# Patient Record
Sex: Male | Born: 1960 | Marital: Single | Smoking: Former smoker
Health system: Southern US, Community
[De-identification: ages and names within clinical notes are randomized; demographics above are authoritative.]

## PROBLEM LIST (undated history)

## (undated) DIAGNOSIS — C61 Malignant neoplasm of prostate: Secondary | ICD-10-CM

## (undated) DIAGNOSIS — B1920 Unspecified viral hepatitis C without hepatic coma: Secondary | ICD-10-CM

## (undated) DIAGNOSIS — U071 COVID-19: Secondary | ICD-10-CM

---

## 2019-05-18 ENCOUNTER — Other Ambulatory Visit: Payer: Self-pay

## 2019-05-18 ENCOUNTER — Inpatient Hospital Stay (HOSPITAL_COMMUNITY)
Admission: EM | Admit: 2019-05-18 | Discharge: 2019-05-24 | DRG: 177 | Attending: Internal Medicine | Admitting: Internal Medicine

## 2019-05-18 ENCOUNTER — Encounter (HOSPITAL_COMMUNITY): Payer: Self-pay

## 2019-05-18 ENCOUNTER — Emergency Department (HOSPITAL_COMMUNITY)

## 2019-05-18 DIAGNOSIS — J1282 Pneumonia due to coronavirus disease 2019: Secondary | ICD-10-CM | POA: Diagnosis present

## 2019-05-18 DIAGNOSIS — Z8615 Personal history of latent tuberculosis infection: Secondary | ICD-10-CM

## 2019-05-18 DIAGNOSIS — U071 COVID-19: Principal | ICD-10-CM | POA: Diagnosis present

## 2019-05-18 DIAGNOSIS — R0602 Shortness of breath: Secondary | ICD-10-CM

## 2019-05-18 DIAGNOSIS — J9601 Acute respiratory failure with hypoxia: Secondary | ICD-10-CM | POA: Diagnosis present

## 2019-05-18 DIAGNOSIS — Z8546 Personal history of malignant neoplasm of prostate: Secondary | ICD-10-CM | POA: Diagnosis present

## 2019-05-18 DIAGNOSIS — Z923 Personal history of irradiation: Secondary | ICD-10-CM

## 2019-05-18 DIAGNOSIS — Z87891 Personal history of nicotine dependence: Secondary | ICD-10-CM

## 2019-05-18 DIAGNOSIS — Z8619 Personal history of other infectious and parasitic diseases: Secondary | ICD-10-CM

## 2019-05-18 DIAGNOSIS — J1289 Other viral pneumonia: Secondary | ICD-10-CM | POA: Diagnosis present

## 2019-05-18 HISTORY — DX: Unspecified viral hepatitis C without hepatic coma: B19.20

## 2019-05-18 HISTORY — DX: Malignant neoplasm of prostate: C61

## 2019-05-18 HISTORY — DX: COVID-19: U07.1

## 2019-05-18 LAB — CBC WITH DIFFERENTIAL/PLATELET
Abs Immature Granulocytes: 0.03 10*3/uL (ref 0.00–0.07)
Basophils Absolute: 0 10*3/uL (ref 0.0–0.1)
Basophils Relative: 0 %
Eosinophils Absolute: 0 10*3/uL (ref 0.0–0.5)
Eosinophils Relative: 0 %
HCT: 43.6 % (ref 39.0–52.0)
Hemoglobin: 14 g/dL (ref 13.0–17.0)
Immature Granulocytes: 0 %
Lymphocytes Relative: 15 %
Lymphs Abs: 1.1 10*3/uL (ref 0.7–4.0)
MCH: 29 pg (ref 26.0–34.0)
MCHC: 32.1 g/dL (ref 30.0–36.0)
MCV: 90.5 fL (ref 80.0–100.0)
Monocytes Absolute: 0.5 10*3/uL (ref 0.1–1.0)
Monocytes Relative: 7 %
Neutro Abs: 5.6 10*3/uL (ref 1.7–7.7)
Neutrophils Relative %: 78 %
Platelets: 236 10*3/uL (ref 150–400)
RBC: 4.82 MIL/uL (ref 4.22–5.81)
RDW: 12.4 % (ref 11.5–15.5)
WBC: 7.2 10*3/uL (ref 4.0–10.5)
nRBC: 0 % (ref 0.0–0.2)

## 2019-05-18 LAB — COMPREHENSIVE METABOLIC PANEL
ALT: 23 U/L (ref 0–44)
AST: 42 U/L — ABNORMAL HIGH (ref 15–41)
Albumin: 3.1 g/dL — ABNORMAL LOW (ref 3.5–5.0)
Alkaline Phosphatase: 30 U/L — ABNORMAL LOW (ref 38–126)
Anion gap: 13 (ref 5–15)
BUN: 10 mg/dL (ref 6–20)
CO2: 20 mmol/L — ABNORMAL LOW (ref 22–32)
Calcium: 8 mg/dL — ABNORMAL LOW (ref 8.9–10.3)
Chloride: 101 mmol/L (ref 98–111)
Creatinine, Ser: 1.13 mg/dL (ref 0.61–1.24)
GFR calc Af Amer: >60 mL/min (ref >60)
GFR calc non Af Amer: >60 mL/min (ref >60)
Glucose, Bld: 114 mg/dL — ABNORMAL HIGH (ref 70–99)
Potassium: 3.8 mmol/L (ref 3.5–5.1)
Sodium: 134 mmol/L — ABNORMAL LOW (ref 135–145)
Total Bilirubin: 1.3 mg/dL — ABNORMAL HIGH (ref 0.3–1.2)
Total Protein: 7.3 g/dL (ref 6.5–8.1)

## 2019-05-18 MED ORDER — ALBUTEROL SULFATE HFA 108 (90 BASE) MCG/ACT IN AERS
6.0000 | INHALATION_SPRAY | RESPIRATORY_TRACT | Status: DC | PRN
  Administered 2019-05-18: 22:00:00 6 via RESPIRATORY_TRACT
  Filled 2019-05-18: qty 6.7

## 2019-05-18 MED ORDER — DEXAMETHASONE SODIUM PHOSPHATE 10 MG/ML IJ SOLN
10.0000 mg | Freq: Once | INTRAMUSCULAR | Status: AC
  Administered 2019-05-18: 23:00:00 10 mg via INTRAVENOUS
  Filled 2019-05-18: qty 1

## 2019-05-18 NOTE — ED Provider Notes (Signed)
[facility] EMERGENCY DEPARTMENT Provider Note   CSN: [id number] Arrival date & time: 05/18/19  1957     History   Chief Complaint Chief Complaint  Patient presents with  . Shortness of Breath    HPI Philip Reed is a 58 y.o. male.     Patient complains of shortness of breath.  Patient has been admitted recently for work-up of infection [facility] and has become short of breath and hypoxic.  The history is provided by the patient and a caregiver. No language interpreter was used.  Shortness of Breath Severity:  Moderate Onset quality:  Sudden Timing:  Constant Progression:  Worsening Chronicity:  Recurrent Context: activity   Relieved by:  Nothing Worsened by:  Nothing Ineffective treatments:  None tried Associated symptoms: no abdominal pain, no chest pain, no cough, no headaches and no rash     Past Medical History:  Diagnosis Date  . COVID-19   . Hepatitis C   . Prostate cancer ([place])     There are no active problems to display for this patient.   History reviewed. No pertinent surgical history.      Home Medications    Prior to Admission medications   Not on File    Family History History reviewed. No pertinent family history.  Social History Social History   Tobacco Use  . Smoking status: Former Smoker    Packs/day: 0.25    Types: Cigarettes  . Smokeless tobacco: Never Used  Substance Use Topics  . Alcohol use: Not Currently    Alcohol/week: 2.0 standard drinks    Types: 2 Shots of liquor per week  . Drug use: Yes    Types: Marijuana, Cocaine, IV     Allergies   Patient has no allergy information on record.   Review of Systems Review of Systems  Constitutional: Negative for appetite change and fatigue.  HENT: Negative for congestion, ear discharge and sinus pressure.   Eyes: Negative for discharge.  Respiratory: Positive for shortness of breath. Negative for cough.   Cardiovascular: Negative for chest pain.   Gastrointestinal: Negative for abdominal pain and diarrhea.  Genitourinary: Negative for frequency and hematuria.  Musculoskeletal: Negative for back pain.  Skin: Negative for rash.  Neurological: Negative for seizures and headaches.  Psychiatric/Behavioral: Negative for hallucinations.     Physical Exam Updated Vital Signs BP 120/69   Pulse 100   Temp 98.9 F (37.2 C) (Oral)   Resp (!) 41   Ht 6\' 4"  (1.93 m)   Wt 108.9 kg   SpO2 96%   BMI 29.21 kg/m   Physical Exam Vitals signs and nursing note reviewed.  Constitutional:      Appearance: He is well-developed.  HENT:     Head: Normocephalic.     Nose: Nose normal.  Eyes:     General: No scleral icterus.    Conjunctiva/sclera: Conjunctivae normal.  Neck:     Musculoskeletal: Neck supple.     Thyroid: No thyromegaly.  Cardiovascular:     Rate and Rhythm: Normal rate and regular rhythm.     Heart sounds: No murmur. No friction rub. No gallop.   Pulmonary:     Effort: Respiratory distress present.     Breath sounds: No stridor. No wheezing or rales.  Chest:     Chest wall: No tenderness.  Abdominal:     General: There is no distension.     Tenderness: There is no abdominal tenderness. There is no rebound.  Musculoskeletal: Normal range of motion.  Lymphadenopathy:     Cervical: No cervical adenopathy.  Skin:    Findings: No erythema or rash.  Neurological:     Mental Status: He is oriented to person, place, and time.     Motor: No abnormal muscle tone.     Coordination: Coordination normal.  Psychiatric:        Behavior: Behavior normal.      ED Treatments / Results  Labs (all labs ordered are listed, but only abnormal results are displayed) Labs Reviewed  COMPREHENSIVE METABOLIC PANEL - Abnormal; Notable for the following components:      Result Value   Sodium 134 (*)    CO2 20 (*)    Glucose, Bld 114 (*)    Calcium 8.0 (*)    Albumin 3.1 (*)    AST 42 (*)    Alkaline Phosphatase 30 (*)    Total  Bilirubin 1.3 (*)    All other components within normal limits  CBC WITH DIFFERENTIAL/PLATELET    EKG None  Radiology Dg Chest Portable 1 View  Result Date: 05/18/2019 CLINICAL DATA:  58 year old male recently diagnosed with COVID-19. Shortness of breath. EXAM: PORTABLE CHEST 1 VIEW COMPARISON:  Person toggle case portable chest 05/16/2019. FINDINGS: Portable AP upright view at 2137 hours. Increasing multifocal bilateral confluent but indistinct pulmonary opacity, more pronounced in the right lung. Stable somewhat low lung volumes. No pneumothorax or pleural effusion. Mediastinal contours and tracheal air column remain within normal limits. Negative visible bowel gas. No acute osseous abnormality identified. IMPRESSION: Increasing bilateral airspace opacity since 05/16/2019 compatible with progressive bilateral COVID-19 pneumonia in this clinical setting. Electronically Signed   By: Genevie Ann M.D.   On: 05/18/2019 21:56    Procedures Procedures (including critical care time)  Medications Ordered in ED Medications  albuterol (VENTOLIN HFA) 108 (90 Base) MCG/ACT inhaler 6 puff (6 puffs Inhalation Given 05/18/19 2149)  dexamethasone (DECADRON) injection 10 mg (10 mg Intravenous Given 05/18/19 2312)     Initial Impression / Assessment and Plan / ED Course  I have reviewed the triage vital signs and the nursing notes.  Pertinent labs & imaging results that were available during my care of the patient were reviewed by me and considered in my medical decision making (see chart for details). Philip Reed was evaluated in Emergency Department on 05/18/2019 for the symptoms described in the history of present illness. He was evaluated in the context of the global COVID-19 pandemic, which necessitated consideration that the patient might be at risk for infection with the SARS-CoV-2 virus that causes COVID-19. Institutional protocols and algorithms that pertain to the evaluation of patients at risk  for COVID-19 are in a state of rapid change based on information released by regulatory bodies including the CDC and federal and state organizations. These policies and algorithms were followed during the patient's care in the ED.        Patient will be admitted for hypoxia secondary to COVID.  Final Clinical Impressions(s) / ED Diagnoses   Final diagnoses:  None    ED Discharge Orders    None       Milton Ferguson, MD 05/18/19 2322

## 2019-05-18 NOTE — ED Notes (Addendum)
Pt O2 remained at 91% without oxygen, however pt began to experience labored breathing and tachypnea. RR >30/min without supplemental O2.

## 2019-05-19 DIAGNOSIS — Z87891 Personal history of nicotine dependence: Secondary | ICD-10-CM | POA: Diagnosis not present

## 2019-05-19 DIAGNOSIS — J9601 Acute respiratory failure with hypoxia: Secondary | ICD-10-CM | POA: Diagnosis present

## 2019-05-19 DIAGNOSIS — U071 COVID-19: Secondary | ICD-10-CM | POA: Diagnosis present

## 2019-05-19 DIAGNOSIS — R0602 Shortness of breath: Secondary | ICD-10-CM | POA: Diagnosis not present

## 2019-05-19 DIAGNOSIS — J1289 Other viral pneumonia: Secondary | ICD-10-CM

## 2019-05-19 DIAGNOSIS — Z923 Personal history of irradiation: Secondary | ICD-10-CM | POA: Diagnosis not present

## 2019-05-19 DIAGNOSIS — Z8619 Personal history of other infectious and parasitic diseases: Secondary | ICD-10-CM | POA: Diagnosis not present

## 2019-05-19 DIAGNOSIS — Z8615 Personal history of latent tuberculosis infection: Secondary | ICD-10-CM | POA: Diagnosis not present

## 2019-05-19 DIAGNOSIS — Z8546 Personal history of malignant neoplasm of prostate: Secondary | ICD-10-CM | POA: Diagnosis not present

## 2019-05-19 LAB — COMPREHENSIVE METABOLIC PANEL
ALT: 23 U/L (ref 0–44)
AST: 40 U/L (ref 15–41)
Albumin: 3 g/dL — ABNORMAL LOW (ref 3.5–5.0)
Alkaline Phosphatase: 30 U/L — ABNORMAL LOW (ref 38–126)
Anion gap: 12 (ref 5–15)
BUN: 12 mg/dL (ref 6–20)
CO2: 22 mmol/L (ref 22–32)
Calcium: 8.5 mg/dL — ABNORMAL LOW (ref 8.9–10.3)
Chloride: 103 mmol/L (ref 98–111)
Creatinine, Ser: 0.96 mg/dL (ref 0.61–1.24)
GFR calc Af Amer: >60 mL/min (ref >60)
GFR calc non Af Amer: >60 mL/min (ref >60)
Glucose, Bld: 150 mg/dL — ABNORMAL HIGH (ref 70–99)
Potassium: 4 mmol/L (ref 3.5–5.1)
Sodium: 137 mmol/L (ref 135–145)
Total Bilirubin: 0.9 mg/dL (ref 0.3–1.2)
Total Protein: 7.3 g/dL (ref 6.5–8.1)

## 2019-05-19 LAB — C-REACTIVE PROTEIN: CRP: 9.2 mg/dL — ABNORMAL HIGH (ref <1.0)

## 2019-05-19 LAB — CBC
HCT: 43.2 % (ref 39.0–52.0)
HCT: 39.7 % (ref 39.0–52.0)
Hemoglobin: 14.1 g/dL (ref 13.0–17.0)
Hemoglobin: 13.9 g/dL (ref 13.0–17.0)
MCH: 29.7 pg (ref 26.0–34.0)
MCH: 30.6 pg (ref 26.0–34.0)
MCHC: 32.6 g/dL (ref 30.0–36.0)
MCHC: 35 g/dL (ref 30.0–36.0)
MCV: 91.1 fL (ref 80.0–100.0)
MCV: 87.4 fL (ref 80.0–100.0)
Platelets: 264 10*3/uL (ref 150–400)
Platelets: 267 10*3/uL (ref 150–400)
RBC: 4.74 MIL/uL (ref 4.22–5.81)
RBC: 4.54 MIL/uL (ref 4.22–5.81)
RDW: 12.2 % (ref 11.5–15.5)
RDW: 12.3 % (ref 11.5–15.5)
WBC: 6.1 10*3/uL (ref 4.0–10.5)
WBC: 7.2 10*3/uL (ref 4.0–10.5)
nRBC: 0 % (ref 0.0–0.2)
nRBC: 0 % (ref 0.0–0.2)

## 2019-05-19 LAB — ABO/RH: ABO/RH(D): O POS

## 2019-05-19 LAB — CREATININE, SERUM
Creatinine, Ser: 1.15 mg/dL (ref 0.61–1.24)
GFR calc Af Amer: >60 mL/min (ref >60)
GFR calc non Af Amer: >60 mL/min (ref >60)

## 2019-05-19 LAB — LACTATE DEHYDROGENASE: LDH: 370 U/L — ABNORMAL HIGH (ref 98–192)

## 2019-05-19 LAB — SARS CORONAVIRUS 2 BY RT PCR (HOSPITAL ORDER, PERFORMED IN ~~LOC~~ HOSPITAL LAB): SARS Coronavirus 2: POSITIVE — AB

## 2019-05-19 LAB — PROCALCITONIN: Procalcitonin: 0.21 ng/mL

## 2019-05-19 LAB — D-DIMER, QUANTITATIVE: D-Dimer, Quant: 0.88 ug/mL-FEU — ABNORMAL HIGH (ref 0.00–0.50)

## 2019-05-19 LAB — FERRITIN: Ferritin: 549 ng/mL — ABNORMAL HIGH (ref 24–336)

## 2019-05-19 LAB — BRAIN NATRIURETIC PEPTIDE: B Natriuretic Peptide: 24 pg/mL (ref 0.0–100.0)

## 2019-05-19 LAB — HIV ANTIBODY (ROUTINE TESTING W REFLEX): HIV Screen 4th Generation wRfx: NONREACTIVE

## 2019-05-19 MED ORDER — SODIUM CHLORIDE 0.9 % IV SOLN
100.0000 mg | INTRAVENOUS | Status: AC
  Administered 2019-05-20 – 2019-05-23 (×4): 100 mg via INTRAVENOUS
  Filled 2019-05-19 (×4): qty 20

## 2019-05-19 MED ORDER — ALBUTEROL SULFATE (2.5 MG/3ML) 0.083% IN NEBU: 2.5000 mg | INHALATION_SOLUTION | Freq: Four times a day (QID) | RESPIRATORY_TRACT | Status: DC | PRN

## 2019-05-19 MED ORDER — ONDANSETRON HCL 4 MG PO TABS: 4.0000 mg | ORAL_TABLET | Freq: Four times a day (QID) | ORAL | Status: DC | PRN

## 2019-05-19 MED ORDER — SODIUM CHLORIDE 0.9 % IV SOLN: 100.0000 mg | INTRAVENOUS | Status: DC

## 2019-05-19 MED ORDER — PANTOPRAZOLE SODIUM 40 MG PO TBEC
40.0000 mg | DELAYED_RELEASE_TABLET | Freq: Every day | ORAL | Status: DC
  Administered 2019-05-19 – 2019-05-24 (×6): 40 mg via ORAL
  Filled 2019-05-19 (×6): qty 1

## 2019-05-19 MED ORDER — SODIUM CHLORIDE 0.9 % IV SOLN
200.0000 mg | Freq: Once | INTRAVENOUS | Status: AC
  Administered 2019-05-19: 200 mg via INTRAVENOUS
  Filled 2019-05-19: qty 40

## 2019-05-19 MED ORDER — SODIUM CHLORIDE 0.9 % IV SOLN: INTRAVENOUS | Status: DC

## 2019-05-19 MED ORDER — METHYLPREDNISOLONE SODIUM SUCC 125 MG IJ SOLR
60.0000 mg | Freq: Two times a day (BID) | INTRAMUSCULAR | Status: DC
  Administered 2019-05-19 – 2019-05-24 (×10): 60 mg via INTRAVENOUS
  Filled 2019-05-19 (×10): qty 2

## 2019-05-19 MED ORDER — ALBUTEROL SULFATE HFA 108 (90 BASE) MCG/ACT IN AERS
1.0000 | INHALATION_SPRAY | Freq: Four times a day (QID) | RESPIRATORY_TRACT | Status: DC | PRN
  Filled 2019-05-19: qty 6.7

## 2019-05-19 MED ORDER — ACETAMINOPHEN 325 MG PO TABS: 650.0000 mg | ORAL_TABLET | Freq: Four times a day (QID) | ORAL | Status: DC | PRN

## 2019-05-19 MED ORDER — SODIUM CHLORIDE 0.9 % IV SOLN: 200.0000 mg | Freq: Once | INTRAVENOUS | Status: DC

## 2019-05-19 MED ORDER — ENOXAPARIN SODIUM 40 MG/0.4ML ~~LOC~~ SOLN
40.0000 mg | SUBCUTANEOUS | Status: DC
  Administered 2019-05-19: 40 mg via SUBCUTANEOUS
  Filled 2019-05-19: qty 0.4

## 2019-05-19 MED ORDER — ONDANSETRON HCL 4 MG/2ML IJ SOLN: 4.0000 mg | Freq: Four times a day (QID) | INTRAMUSCULAR | Status: DC | PRN

## 2019-05-19 NOTE — Progress Notes (Signed)
Pharmacy Brief Note   O:  ALT: 23  CXR: Increasing bilateral airspace opacity since 05/16/2019 compatible with progressive bilateral COVID-19 pneumonia in this clinical setting.  SpO2: O2 sats 91% on room air.    A/P:  Patient meets requirements for remdesivir therapy.  Will start  remdesivir 200 mg IV x 1  followed by 100 mg IV daily x 4 days.  Monitor ALT  Royetta Asal, PharmD, BCPS 05/19/2019 6:46 AM

## 2019-05-19 NOTE — H&P (Signed)
TRH H&P    Patient Demographics:    Philip Reed, is a 58 y.o. male  MRN: [id number]  DOB - [birth year]  Admit Date - 05/18/2019  Referring MD/NP/PA: Dr. Roderic Palau  Outpatient Primary MD for the patient is Patient, No Pcp Per  Patient coming from: [place]  Chief complaint-shortness of breath   HPI:    Philip Reed  is a 58 y.o. male, with history of prostate cancer, hepatitis C who was recently diagnosed with COVID-19 at  [facility], and was [facility].  He was found to be tachypneic and short of breath and was sent back to ED. in the ED patient was found to be tachypneic with O2 sats 91% on room air.  Chest x-ray showed worsening infiltrate as compared to x-ray from 05/16/2019. Patient is a poor historian. Denies chest pain or shortness of breath. Denies coughing up any phlegm. Denies fever No previous history of stroke or seizures. Repeat COVID-19 test was positive in the ED Patient given Decadron 10 mg IV x1.    Review of systems:    In addition to the HPI above,   All other systems reviewed and are negative.    Past History of the following :    Past Medical History:  Diagnosis Date  . COVID-19   . Hepatitis C   . Prostate cancer [facility])          Social History:      Social History   Tobacco Use  . Smoking status: Former Smoker    Packs/day: 0.25    Types: Cigarettes  . Smokeless tobacco: Never Used  Substance Use Topics  . Alcohol use: Not Currently    Alcohol/week: 2.0 standard drinks    Types: 2 Shots of liquor per week       Family History :   Noncontributory   Home Medications:   Prior to Admission medications   Not on File     Allergies:    Not on File   Physical Exam:   Vitals  Blood pressure 112/80, pulse 88, temperature 98.9 F (37.2 C), temperature source Oral, resp. rate (!) 36, height 6\' 4"  (1.93 m), weight 108.9 kg,  SpO2 96 %.  1.  General: Appears in no acute distress  2. Psychiatric: Alert, oriented x3, intact insight and judgment  3. Neurologic: Cranial nerves II through XII grossly intact, motor strength 5/5 in all extremities  4. HEENMT:  Atraumatic normocephalic  5. Respiratory : Clear to auscultation bilaterally  6. Cardiovascular : S1-S2, regular, no murmur auscultated  7. Gastrointestinal:  Abdomen is soft, nontender, no organomegaly  8. Skin:  No rashes noted      Data Review:    CBC Recent Labs  Lab 05/18/19 2223  WBC 7.2  HGB 14.0  HCT 43.6  PLT 236  MCV 90.5  MCH 29.0  MCHC 32.1  RDW 12.4  LYMPHSABS 1.1  MONOABS 0.5  EOSABS 0.0  BASOSABS 0.0   ------------------------------------------------------------------------------------------------------------------  Results for orders placed or performed during the hospital encounter  of 05/18/19 (from the past 48 hour(s))  CBC with Differential/Platelet     Status: None   Collection Time: 05/18/19 10:23 PM  Result Value Ref Range   WBC 7.2 4.0 - 10.5 K/uL   RBC 4.82 4.22 - 5.81 MIL/uL   Hemoglobin 14.0 13.0 - 17.0 g/dL   HCT 43.6 39.0 - 52.0 %   MCV 90.5 80.0 - 100.0 fL   MCH 29.0 26.0 - 34.0 pg   MCHC 32.1 30.0 - 36.0 g/dL   RDW 12.4 11.5 - 15.5 %   Platelets 236 150 - 400 K/uL   nRBC 0.0 0.0 - 0.2 %   Neutrophils Relative % 78 %   Neutro Abs 5.6 1.7 - 7.7 K/uL   Lymphocytes Relative 15 %   Lymphs Abs 1.1 0.7 - 4.0 K/uL   Monocytes Relative 7 %   Monocytes Absolute 0.5 0.1 - 1.0 K/uL   Eosinophils Relative 0 %   Eosinophils Absolute 0.0 0.0 - 0.5 K/uL   Basophils Relative 0 %   Basophils Absolute 0.0 0.0 - 0.1 K/uL   Immature Granulocytes 0 %   Abs Immature Granulocytes 0.03 0.00 - 0.07 K/uL    Comment: Performed at [facility], [address] [place], [place] [address]  Comprehensive metabolic panel     Status: Abnormal   Collection Time: 05/18/19 10:23 PM  Result Value Ref Range   Sodium 134  (L) 135 - 145 mmol/L   Potassium 3.8 3.5 - 5.1 mmol/L   Chloride 101 98 - 111 mmol/L   CO2 20 (L) 22 - 32 mmol/L   Glucose, Bld 114 (H) 70 - 99 mg/dL   BUN 10 6 - 20 mg/dL   Creatinine, Ser 1.13 0.61 - 1.24 mg/dL   Calcium 8.0 (L) 8.9 - 10.3 mg/dL   Total Protein 7.3 6.5 - 8.1 g/dL   Albumin 3.1 (L) 3.5 - 5.0 g/dL   AST 42 (H) 15 - 41 U/L   ALT 23 0 - 44 U/L   Alkaline Phosphatase 30 (L) 38 - 126 U/L   Total Bilirubin 1.3 (H) 0.3 - 1.2 mg/dL   GFR calc non Af Amer >60 >60 mL/min   GFR calc Af Amer >60 >60 mL/min   Anion gap 13 5 - 15    Comment: Performed at [facility], [address], [place], [place] [address]  SARS Coronavirus 2 by RT PCR (hospital order, performed in [facility] hospital lab) Nasopharyngeal Nasopharyngeal Swab     Status: Abnormal   Collection Time: 05/19/19 12:27 AM   Specimen: Nasopharyngeal Swab  Result Value Ref Range   SARS Coronavirus 2 POSITIVE (A) NEGATIVE    Comment: RESULT CALLED TO, READ BACK BY AND VERIFIED WITH: T EASTER,RN@[id number]  05/19/19 MKELLY (NOTE) If result is NEGATIVE SARS-CoV-2 target nucleic acids are NOT DETECTED. The SARS-CoV-2 RNA is generally detectable in upper and lower  respiratory specimens during the acute phase of infection. The lowest  concentration of SARS-CoV-2 viral copies this assay can detect is 250  copies / mL. A negative result does not preclude SARS-CoV-2 infection  and should not be used as the sole basis for treatment or other  patient management decisions.  A negative result may occur with  improper specimen collection / handling, submission of specimen other  than nasopharyngeal swab, presence of viral mutation(s) within the  areas targeted by this assay, and inadequate number of viral copies  (<250 copies / mL). A negative result must be combined with clinical  observations, patient history, and epidemiological information. If result is POSITIVE SARS-CoV-2 target nucleic acids are DETECTED. The  SARS-CoV-2  RNA is generally detectable in upper and lower  respiratory specimens during the acute phase of infection.  Positive  results are indicative of active infection with SARS-CoV-2.  Clinical  correlation with patient history and other diagnostic information is  necessary to determine patient infection status.  Positive results do  not rule out bacterial infection or co-infection with other viruses. If result is PRESUMPTIVE POSTIVE SARS-CoV-2 nucleic acids MAY BE PRESENT.   A presumptive positive result was obtained on the submitted specimen  and confirmed on repeat testing.  While 2019 novel coronavirus  (SARS-CoV-2) nucleic acids may be present in the submitted sample  additional confirmatory testing may be necessary for epidemiological  and / or clinical management purposes  to differentiate between  SARS-CoV-2 and other Sarbecovirus currently known to infect humans.  If clinically indicated additional testing with an alternate test  methodology [phone]) is ad vised. The SARS-CoV-2 RNA is generally  detectable in upper and lower respiratory specimens during the acute  phase of infection. The expected result is Negative. Fact Sheet for Patients:  StrictlyIdeas.no Fact Sheet for Healthcare Providers: BankingDealers.co.za This test is not yet approved or cleared by the [place] FDA and has been authorized for detection and/or diagnosis of SARS-CoV-2 by FDA under an Emergency Use Authorization (EUA).  This EUA will remain in effect (meaning this test can be used) for the duration of the COVID-19 declaration under Section 564(b)(1) of the Act, 21 U.S.C. section 360bbb-3(b)(1), unless the authorization is terminated or revoked sooner. Performed at [facility], [address] [place], [place] [address]     Chemistries  Recent Labs  Lab 05/18/19 2223  NA 134*  K 3.8  CL 101  CO2 20*  GLUCOSE 114*  BUN 10  CREATININE 1.13   CALCIUM 8.0*  AST 42*  ALT 23  ALKPHOS 30*  BILITOT 1.3*   ------------------------------------------------------------------------------------------------------------------  ------------------------------------------------------------------------------------------------------------------ GFR: Estimated Creatinine Clearance: 97.5 mL/min (by C-G formula based on SCr of 1.13 mg/dL). Liver Function Tests: Recent Labs  Lab 05/18/19 2223  AST 42*  ALT 23  ALKPHOS 30*  BILITOT 1.3*  PROT 7.3  ALBUMIN 3.1*  --------------------------------------------------------------------------------------------------------------- Urine analysis: No results found for: COLORURINE, APPEARANCEUR, LABSPEC, PHURINE, GLUCOSEU, HGBUR, BILIRUBINUR, KETONESUR, PROTEINUR, UROBILINOGEN, NITRITE, LEUKOCYTESUR    Imaging Results:    Dg Chest Portable 1 View  Result Date: 05/18/2019 CLINICAL DATA:  58 year old male recently diagnosed with COVID-19. Shortness of breath. EXAM: PORTABLE CHEST 1 VIEW COMPARISON:  Person toggle case portable chest 05/16/2019. FINDINGS: Portable AP upright view at 2137 hours. Increasing multifocal bilateral confluent but indistinct pulmonary opacity, more pronounced in the right lung. Stable somewhat low lung volumes. No pneumothorax or pleural effusion. Mediastinal contours and tracheal air column remain within normal limits. Negative visible bowel gas. No acute osseous abnormality identified. IMPRESSION: Increasing bilateral airspace opacity since 05/16/2019 compatible with progressive bilateral COVID-19 pneumonia in this clinical setting. Electronically Signed   By: Genevie Ann M.D.   On: 05/18/2019 21:56    My personal review of EKG: Rhythm NSR   Assessment & Plan:    Active Problems:   Pneumonia due to COVID-19 virus   1. Pneumonia due to COVID-19-chest x-ray shows worsening infiltrates as compared to 05/16/2019.  Will start patient on Decadron 6 mg p.o. every 12 hours.   Remdesivir per pharmacy consultation.  Patient's O2 sats was 91% on room air.  Currently not requiring oxygen. Continuous pulse ox monitoring.    DVT Prophylaxis-   Lovenox   AM Labs Ordered, also please review Full Orders  Family Communication: Admission, patients condition and plan of care including tests being ordered have been discussed with the patient  who indicate understanding and agree with the plan and Code Status.  Code Status: Full code  Admission status: Inpatient: Based on patients clinical presentation and evaluation of above clinical data, I have made determination that patient meets Inpatient criteria at this time.  Time spent in minutes : 60 minutes   Oswald Hillock M.D on 05/19/2019 at 3:15 AM

## 2019-05-19 NOTE — Progress Notes (Signed)
PROGRESS NOTE                                                                                                                                                                                                             Patient Demographics:    Philip Reed, is a 58 y.o. male, DOB - 14-Feb-1961, KW:[id number]  Outpatient Primary MD for the patient is Patient, No Pcp Per   Admit date - 05/18/2019   LOS - 0  Chief Complaint  Patient presents with  . Shortness of Breath       Brief Narrative: Patient is a 58 y.o. male with PMHx of  hepatitis C (per patient treated), prostate cancer s/p radiation therapy-incarcerated at [facility] diagnosed with COVID 19 approximately one week at a outside facility-presenting with worsening SOB-found to have acute hypoxic respiratory failure secondary to COVID 19 PNA. See below for further details.    Subjective:    Philip Reed today feels essentially the same. No chest pain. No SOB at rest.    Assessment  & Plan :   Acute Hypoxic Resp Failure due to Covid 19 Viral pneumonia: on 3L of O2-just being started on Remdesivir-change from decadron to Solumedrol. If worsens-can consider Convalescent plasma-but better to avoid actemra given possible hx of Latent TB  Fever: afebrile  O2 requirements: On 3l/m   COVID-19 Labs: Recent Labs    05/19/19 0903 05/19/19 0906  DDIMER 0.88*  --   FERRITIN  --  549*  CRP  --  9.2*    Lab Results  Component Value Date   SARSCOV2NAA POSITIVE (A) 05/19/2019     COVID-19 Medications: Steroids:10/14>> Remdesivir:10/15>> Actemra: given hx of latent TB-contraindicated Convalescent Plasma:Not indicated-will give if hypoxia worsens Research Studies:N/A  Other medications: Diuretics:Euvolemic-no need for lasix Antibiotics:Not needed as no evidence of bacterial infection  Prone/Incentive Spirometry: encouraged patient to lie prone for 3-4 hours at a time  for a total of 16 hours a day, and to encourage incentive spirometry use 3-4/hour.  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  History of prostate cancer s/p radiation therapy  ABG: No results found for: PHART, PCO2ART, PO2ART, HCO3, TCO2, ACIDBASEDEF, O2SAT  Vent Settings: N/A  Condition - Stable  Family Communication  : None available at bedside  Code Status :  Full Code  Diet :  Diet Order    None       Disposition Plan  :  Remain hospitalized  Barriers to discharge:hypoxic-need to wean O2 further  Consults  :  None  Procedures  :  None  Antibiotics  :    Anti-infectives (From admission, onward)   Start     Dose/Rate Route Frequency Ordered Stop   05/20/19 1000  remdesivir 100 mg in sodium chloride 0.9 % 250 mL IVPB     100 mg 500 mL/hr over 30 Minutes Intravenous Every 24 hours 05/19/19 1100 05/24/19 0959   05/20/19 0700  remdesivir 100 mg in sodium chloride 0.9 % 250 mL IVPB  Status:  Discontinued     100 mg 500 mL/hr over 30 Minutes Intravenous Every 24 hours 05/19/19 0646 05/19/19 1059   05/19/19 1200  remdesivir 200 mg in sodium chloride 0.9 % 250 mL IVPB     200 mg 500 mL/hr over 30 Minutes Intravenous Once 05/19/19 1100     05/19/19 0645  remdesivir 200 mg in sodium chloride 0.9 % 250 mL IVPB  Status:  Discontinued     200 mg 500 mL/hr over 30 Minutes Intravenous Once 05/19/19 0646 05/19/19 1059      Inpatient Medications  Scheduled Meds: Continuous Infusions: . [START ON 05/20/2019] remdesivir 100 mg in NS 250 mL    . remdesivir 200 mg in NS 250 mL     PRN Meds:.albuterol   Time Spent in minutes  25  See all Orders from today for further details   Philip Reed M.D on 05/19/2019 at 1:48 PM  To page go to [email or web address] - use universal password  [facility] -  Office  [phone]    Objective:   Vitals:   05/19/19 0907 05/19/19 0930 05/19/19 1000 05/19/19 1200   BP:  98/65 120/89 121/75  Pulse:  100 91 83  Resp:  (!) 29 (!) 39   Temp: 98 F (36.7 C)     TempSrc: Oral     SpO2:  (!) 88% 96% 96%  Weight:      Height:        Wt Readings from Last 3 Encounters:  05/19/19 108.9 kg    No intake or output data in the 24 hours ending 05/19/19 1348   Physical Exam Gen Exam:Alert awake-not in any distress HEENT:atraumatic, normocephalic Chest: B/L clear to auscultation anteriorly CVS:S1S2 regular Abdomen:soft non tender, non distended Extremities:no edema Neurology: Non focal Skin: no rash   Data Review:    CBC Recent Labs  Lab 05/18/19 2223 05/19/19 0903  WBC 7.2 6.1  HGB 14.0 14.1  HCT 43.6 43.2  PLT 236 264  MCV 90.5 91.1  MCH 29.0 29.7  MCHC 32.1 32.6  RDW 12.4 12.2  LYMPHSABS 1.1  --   MONOABS 0.5  --   EOSABS 0.0  --   BASOSABS 0.0  --     Chemistries  Recent Labs  Lab 05/18/19 2223 05/19/19 0903  NA 134* 137  K 3.8 4.0  CL 101 103  CO2 20* 22  GLUCOSE 114* 150*  BUN 10 12  CREATININE 1.13 0.96  CALCIUM 8.0* 8.5*  AST 42* 40  ALT 23 23  ALKPHOS 30* 30*  BILITOT 1.3* 0.9   ------------------------------------------------------------------------------------------------------------------ No results for input(s): CHOL, HDL, LDLCALC, TRIG, CHOLHDL, LDLDIRECT in the last 72 hours.  No results found for: HGBA1C ------------------------------------------------------------------------------------------------------------------ No results for input(s): TSH, T4TOTAL, T3FREE, THYROIDAB  in the last 72 hours.  Invalid input(s): FREET3 ------------------------------------------------------------------------------------------------------------------ Recent Labs    05/19/19 0906  FERRITIN 549*    Coagulation profile No results for input(s): INR, PROTIME in the last 168 hours.  Recent Labs    05/19/19 0903  DDIMER 0.88*    Cardiac Enzymes No results for input(s): CKMB, TROPONINI, MYOGLOBIN in the last  168 hours.  Invalid input(s): CK ------------------------------------------------------------------------------------------------------------------    Component Value Date/Time   BNP 24.0 05/19/2019 0906    Micro Results Recent Results (from the past 240 hour(s))  SARS Coronavirus 2 by RT PCR (hospital order, performed in [facility] hospital lab) Nasopharyngeal Nasopharyngeal Swab     Status: Abnormal   Collection Time: 05/19/19 12:27 AM   Specimen: Nasopharyngeal Swab  Result Value Ref Range Status   SARS Coronavirus 2 POSITIVE (A) NEGATIVE Final    Comment: RESULT CALLED TO, READ BACK BY AND VERIFIED WITH: T EASTER,RN@[id number]  05/19/19 MKELLY (NOTE) If result is NEGATIVE SARS-CoV-2 target nucleic acids are NOT DETECTED. The SARS-CoV-2 RNA is generally detectable in upper and lower  respiratory specimens during the acute phase of infection. The lowest  concentration of SARS-CoV-2 viral copies this assay can detect is 250  copies / mL. A negative result does not preclude SARS-CoV-2 infection  and should not be used as the sole basis for treatment or other  patient management decisions.  A negative result may occur with  improper specimen collection / handling, submission of specimen other  than nasopharyngeal swab, presence of viral mutation(s) within the  areas targeted by this assay, and inadequate number of viral copies  (<250 copies / mL). A negative result must be combined with clinical  observations, patient history, and epidemiological information. If result is POSITIVE SARS-CoV-2 target nucleic acids are DETECTED. The  SARS-CoV-2 RNA is generally detectable in upper and lower  respiratory specimens during the acute phase of infection.  Positive  results are indicative of active infection with SARS-CoV-2.  Clinical  correlation with patient history and other diagnostic information is  necessary to determine patient infection status.  Positive results do  not rule out  bacterial infection or co-infection with other viruses. If result is PRESUMPTIVE POSTIVE SARS-CoV-2 nucleic acids MAY BE PRESENT.   A presumptive positive result was obtained on the submitted specimen  and confirmed on repeat testing.  While 2019 novel coronavirus  (SARS-CoV-2) nucleic acids may be present in the submitted sample  additional confirmatory testing may be necessary for epidemiological  and / or clinical management purposes  to differentiate between  SARS-CoV-2 and other Sarbecovirus currently known to infect humans.  If clinically indicated additional testing with an alternate test  methodology [phone]) is ad vised. The SARS-CoV-2 RNA is generally  detectable in upper and lower respiratory specimens during the acute  phase of infection. The expected result is Negative. Fact Sheet for Patients:  StrictlyIdeas.no Fact Sheet for Healthcare Providers: BankingDealers.co.za This test is not yet approved or cleared by the [place] FDA and has been authorized for detection and/or diagnosis of SARS-CoV-2 by FDA under an Emergency Use Authorization (EUA).  This EUA will remain in effect (meaning this test can be used) for the duration of the COVID-19 declaration under Section 564(b)(1) of the Act, 21 U.S.C. section 360bbb-3(b)(1), unless the authorization is terminated or revoked sooner. Performed at [facility], [address] [place], [place] [address]     Radiology Reports Dg Chest Portable 1 View  Result Date: 05/18/2019 CLINICAL DATA:  58 year old male  recently diagnosed with COVID-19. Shortness of breath. EXAM: PORTABLE CHEST 1 VIEW COMPARISON:  Person toggle case portable chest 05/16/2019. FINDINGS: Portable AP upright view at 2137 hours. Increasing multifocal bilateral confluent but indistinct pulmonary opacity, more pronounced in the right lung. Stable somewhat low lung volumes. No pneumothorax or pleural effusion.  Mediastinal contours and tracheal air column remain within normal limits. Negative visible bowel gas. No acute osseous abnormality identified. IMPRESSION: Increasing bilateral airspace opacity since 05/16/2019 compatible with progressive bilateral COVID-19 pneumonia in this clinical setting. Electronically Signed   By: Genevie Ann M.D.   On: 05/18/2019 21:56

## 2019-05-19 NOTE — ED Notes (Signed)
Pt states he tested positive for covid earlier in the week.

## 2019-05-19 NOTE — ED Notes (Signed)
Date and time results received: 05/19/19 1:38 AM  (use smartphrase ".now" to insert current time)  Test: Covid Critical Value: Positive  Name of Provider Notified: Dr. Darrick Meigs (notified via [facility])  Orders Received? Or Actions Taken?:

## 2019-05-20 LAB — CBC WITH DIFFERENTIAL/PLATELET
Abs Immature Granulocytes: 0.09 10*3/uL — ABNORMAL HIGH (ref 0.00–0.07)
Basophils Absolute: 0 10*3/uL (ref 0.0–0.1)
Basophils Relative: 0 %
Eosinophils Absolute: 0 10*3/uL (ref 0.0–0.5)
Eosinophils Relative: 0 %
HCT: 41.5 % (ref 39.0–52.0)
Hemoglobin: 13.8 g/dL (ref 13.0–17.0)
Immature Granulocytes: 1 %
Lymphocytes Relative: 7 %
Lymphs Abs: 0.8 10*3/uL (ref 0.7–4.0)
MCH: 29.4 pg (ref 26.0–34.0)
MCHC: 33.3 g/dL (ref 30.0–36.0)
MCV: 88.3 fL (ref 80.0–100.0)
Monocytes Absolute: 0.5 10*3/uL (ref 0.1–1.0)
Monocytes Relative: 4 %
Neutro Abs: 9 10*3/uL — ABNORMAL HIGH (ref 1.7–7.7)
Neutrophils Relative %: 88 %
Platelets: 311 10*3/uL (ref 150–400)
RBC: 4.7 MIL/uL (ref 4.22–5.81)
RDW: 12.4 % (ref 11.5–15.5)
WBC: 10.3 10*3/uL (ref 4.0–10.5)
nRBC: 0 % (ref 0.0–0.2)

## 2019-05-20 LAB — MRSA PCR SCREENING: MRSA by PCR: NEGATIVE

## 2019-05-20 LAB — COMPREHENSIVE METABOLIC PANEL
ALT: 30 U/L (ref 0–44)
AST: 47 U/L — ABNORMAL HIGH (ref 15–41)
Albumin: 3 g/dL — ABNORMAL LOW (ref 3.5–5.0)
Alkaline Phosphatase: 29 U/L — ABNORMAL LOW (ref 38–126)
Anion gap: 10 (ref 5–15)
BUN: 16 mg/dL (ref 6–20)
CO2: 23 mmol/L (ref 22–32)
Calcium: 8.8 mg/dL — ABNORMAL LOW (ref 8.9–10.3)
Chloride: 103 mmol/L (ref 98–111)
Creatinine, Ser: 1.06 mg/dL (ref 0.61–1.24)
GFR calc Af Amer: >60 mL/min (ref >60)
GFR calc non Af Amer: >60 mL/min (ref >60)
Glucose, Bld: 161 mg/dL — ABNORMAL HIGH (ref 70–99)
Potassium: 4.4 mmol/L (ref 3.5–5.1)
Sodium: 136 mmol/L (ref 135–145)
Total Bilirubin: 0.7 mg/dL (ref 0.3–1.2)
Total Protein: 7.1 g/dL (ref 6.5–8.1)

## 2019-05-20 LAB — D-DIMER, QUANTITATIVE: D-Dimer, Quant: 0.67 ug/mL-FEU — ABNORMAL HIGH (ref 0.00–0.50)

## 2019-05-20 LAB — C-REACTIVE PROTEIN: CRP: 7.1 mg/dL — ABNORMAL HIGH (ref <1.0)

## 2019-05-20 LAB — FERRITIN: Ferritin: 462 ng/mL — ABNORMAL HIGH (ref 24–336)

## 2019-05-20 MED ORDER — ENOXAPARIN SODIUM 60 MG/0.6ML ~~LOC~~ SOLN
55.0000 mg | SUBCUTANEOUS | Status: DC
  Administered 2019-05-20 – 2019-05-23 (×4): 55 mg via SUBCUTANEOUS
  Filled 2019-05-20 (×4): qty 0.6

## 2019-05-20 NOTE — Plan of Care (Signed)
  Problem: Respiratory: Goal: Will maintain a patent airway Outcome: Progressing Goal: Complications related to the disease process, condition or treatment will be avoided or minimized Outcome: Progressing   

## 2019-05-20 NOTE — Progress Notes (Signed)
Ok to increase Lovenox to 55mg  SQ qday per Dr. Darrold Junker, PharmD, BCIDP, AAHIVP, CPP Infectious Disease Pharmacist 05/20/2019 12:01 PM

## 2019-05-20 NOTE — Plan of Care (Signed)

## 2019-05-20 NOTE — Progress Notes (Addendum)
PROGRESS NOTE                                                                                                                                                                                                             Patient Demographics:    Philip Reed, is a 58 y.o. male, DOB - 1961/04/22, KW:[id number]  Outpatient Primary MD for the patient is Patient, No Pcp Per   Admit date - 05/18/2019   LOS - 1  Chief Complaint  Patient presents with  . Shortness of Breath       Brief Narrative: Patient is a 58 y.o. male with PMHx of  hepatitis C (per patient treated), prostate cancer s/p radiation therapy-incarcerated at [place] diagnosed with COVID 19 approximately one week at a outside facility-presenting with worsening SOB-found to have acute hypoxic respiratory failure secondary to COVID 19 PNA. See below for further details.    Subjective:    Philip Reed thinks he feels slightly better this morning-he apparently was on 5 L of oxygen overnight-but was able to be weaned down to 3 L while I was in the room.   Assessment  & Plan :   Acute Hypoxic Resp Failure due to Covid 19 Viral pneumonia: Remains relatively stable-remains on 3 L of oxygen-CRP is downtrending.  Continue steroids and remdesivir.  Fever: afebrile  O2 requirements: On 3l/m   COVID-19 Labs: Recent Labs    05/19/19 0903 05/19/19 0906 05/19/19 1427 05/20/19 0418  DDIMER 0.88*  --   --  0.67*  FERRITIN  --  549*  --  462*  LDH  --   --  370*  --   CRP  --  9.2*  --  7.1*    Lab Results  Component Value Date   SARSCOV2NAA POSITIVE (A) 05/19/2019     COVID-19 Medications: Steroids:10/14>> Remdesivir:10/15>> Actemra: given hx of latent TB-contraindicated Convalescent Plasma:Not indicated-will give if hypoxia worsens Research Studies:N/A  Other medications: Diuretics:Euvolemic-no need for lasix Antibiotics:Not needed as no evidence of bacterial  infection  Prone/Incentive Spirometry: encouraged patient to lie prone for 3-4 hours at a time for a total of 16 hours a day, and to encourage incentive spirometry use 3-4/hour.  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  History of prostate cancer  s/p radiation therapy  ABG: No results found for: PHART, PCO2ART, PO2ART, HCO3, TCO2, ACIDBASEDEF, O2SAT  Vent Settings: N/A  Condition - Stable  Family Communication  : None available at bedside  Code Status :  Full Code  Diet :  Diet Order            Diet regular Room service appropriate? Yes; Fluid consistency: Thin  Diet effective now               Disposition Plan  :  Remain hospitalized  Barriers to discharge:hypoxic-need to wean O2 further  Consults  :  None  Procedures  :  None  Antibiotics  :    Anti-infectives (From admission, onward)   Start     Dose/Rate Route Frequency Ordered Stop   05/20/19 1000  remdesivir 100 mg in sodium chloride 0.9 % 250 mL IVPB     100 mg 500 mL/hr over 30 Minutes Intravenous Every 24 hours 05/19/19 1100 05/24/19 0959   05/20/19 0700  remdesivir 100 mg in sodium chloride 0.9 % 250 mL IVPB  Status:  Discontinued     100 mg 500 mL/hr over 30 Minutes Intravenous Every 24 hours 05/19/19 0646 05/19/19 1059   05/19/19 1200  remdesivir 200 mg in sodium chloride 0.9 % 250 mL IVPB     200 mg 500 mL/hr over 30 Minutes Intravenous Once 05/19/19 1100 05/19/19 1230   05/19/19 0645  remdesivir 200 mg in sodium chloride 0.9 % 250 mL IVPB  Status:  Discontinued     200 mg 500 mL/hr over 30 Minutes Intravenous Once 05/19/19 0646 05/19/19 1059      Inpatient Medications  Scheduled Meds: . enoxaparin (LOVENOX) injection  40 mg Subcutaneous Q24H  . methylPREDNISolone (SOLU-MEDROL) injection  60 mg Intravenous Q12H  . pantoprazole  40 mg Oral Daily   Continuous Infusions: . sodium chloride Stopped (05/19/19 1741)  . remdesivir  100 mg in NS 250 mL 100 mg (05/20/19 0954)   PRN Meds:.acetaminophen, albuterol, ondansetron **OR** ondansetron (ZOFRAN) IV   Time Spent in minutes  25  See all Orders from today for further details   Oren Binet M.D on 05/20/2019 at 11:38 AM  To page go to [email or web address] - use universal password  [facility] -  Office  [phone]    Objective:   Vitals:   05/20/19 0430 05/20/19 0516 05/20/19 0727 05/20/19 0923  BP: 101/63  93/63   Pulse: 83  80   Resp:   19   Temp: 97.6 F (36.4 C)  97.9 F (36.6 C)   TempSrc: Oral  Oral   SpO2: 90% 93% 94% 91%  Weight:      Height:        Wt Readings from Last 3 Encounters:  05/19/19 108.9 kg     Intake/Output Summary (Last 24 hours) at 05/20/2019 1138 Last data filed at 05/20/2019 0100 Gross per 24 hour  Intake 640 ml  Output 1000 ml  Net -360 ml     Physical Exam Gen Exam:Alert awake-not in any distress HEENT:atraumatic, normocephalic Chest: B/L clear to auscultation anteriorly CVS:S1S2 regular Abdomen:soft non tender, non distended Extremities:no edema Neurology: Non focal Skin: no rash   Data Review:    CBC Recent Labs  Lab 05/18/19 2223 05/19/19 0903 05/19/19 1427 05/20/19 0418  WBC 7.2 6.1 7.2 10.3  HGB 14.0 14.1 13.9 13.8  HCT 43.6 43.2 39.7 41.5  PLT 236 264 267 311  MCV 90.5 91.1 87.4 88.3  MCH 29.0  29.7 30.6 29.4  MCHC 32.1 32.6 35.0 33.3  RDW 12.4 12.2 12.3 12.4  LYMPHSABS 1.1  --   --  0.8  MONOABS 0.5  --   --  0.5  EOSABS 0.0  --   --  0.0  BASOSABS 0.0  --   --  0.0    Chemistries  Recent Labs  Lab 05/18/19 2223 05/19/19 0903 05/19/19 1427 05/20/19 0418  NA 134* 137  --  136  K 3.8 4.0  --  4.4  CL 101 103  --  103  CO2 20* 22  --  23  GLUCOSE 114* 150*  --  161*  BUN 10 12  --  16  CREATININE 1.13 0.96 1.15 1.06  CALCIUM 8.0* 8.5*  --  8.8*  AST 42* 40  --  47*  ALT 23 23  --  30  ALKPHOS 30* 30*  --  29*  BILITOT 1.3* 0.9  --  0.7    ------------------------------------------------------------------------------------------------------------------ No results for input(s): CHOL, HDL, LDLCALC, TRIG, CHOLHDL, LDLDIRECT in the last 72 hours.  No results found for: HGBA1C ------------------------------------------------------------------------------------------------------------------ No results for input(s): TSH, T4TOTAL, T3FREE, THYROIDAB in the last 72 hours.  Invalid input(s): FREET3 ------------------------------------------------------------------------------------------------------------------ Recent Labs    05/19/19 0906 05/20/19 0418  FERRITIN 549* 462*    Coagulation profile No results for input(s): INR, PROTIME in the last 168 hours.  Recent Labs    05/19/19 0903 05/20/19 0418  DDIMER 0.88* 0.67*    Cardiac Enzymes No results for input(s): CKMB, TROPONINI, MYOGLOBIN in the last 168 hours.  Invalid input(s): CK ------------------------------------------------------------------------------------------------------------------    Component Value Date/Time   BNP 24.0 05/19/2019 0906    Micro Results Recent Results (from the past 240 hour(s))  SARS Coronavirus 2 by RT PCR (hospital order, performed in [facility] hospital lab) Nasopharyngeal Nasopharyngeal Swab     Status: Abnormal   Collection Time: 05/19/19 12:27 AM   Specimen: Nasopharyngeal Swab  Result Value Ref Range Status   SARS Coronavirus 2 POSITIVE (A) NEGATIVE Final    Comment: RESULT CALLED TO, READ BACK BY AND VERIFIED WITH: T EASTER,RN@[id number]  05/19/19 MKELLY (NOTE) If result is NEGATIVE SARS-CoV-2 target nucleic acids are NOT DETECTED. The SARS-CoV-2 RNA is generally detectable in upper and lower  respiratory specimens during the acute phase of infection. The lowest  concentration of SARS-CoV-2 viral copies this assay can detect is 250  copies / mL. A negative result does not preclude SARS-CoV-2 infection  and should not be used as  the sole basis for treatment or other  patient management decisions.  A negative result may occur with  improper specimen collection / handling, submission of specimen other  than nasopharyngeal swab, presence of viral mutation(s) within the  areas targeted by this assay, and inadequate number of viral copies  (<250 copies / mL). A negative result must be combined with clinical  observations, patient history, and epidemiological information. If result is POSITIVE SARS-CoV-2 target nucleic acids are DETECTED. The  SARS-CoV-2 RNA is generally detectable in upper and lower  respiratory specimens during the acute phase of infection.  Positive  results are indicative of active infection with SARS-CoV-2.  Clinical  correlation with patient history and other diagnostic information is  necessary to determine patient infection status.  Positive results do  not rule out bacterial infection or co-infection with other viruses. If result is PRESUMPTIVE POSTIVE SARS-CoV-2 nucleic acids MAY BE PRESENT.   A presumptive positive result was obtained on the submitted specimen  and confirmed on repeat testing.  While 2019 novel coronavirus  (SARS-CoV-2) nucleic acids may be present in the submitted sample  additional confirmatory testing may be necessary for epidemiological  and / or clinical management purposes  to differentiate between  SARS-CoV-2 and other Sarbecovirus currently known to infect humans.  If clinically indicated additional testing with an alternate test  methodology [phone]) is ad vised. The SARS-CoV-2 RNA is generally  detectable in upper and lower respiratory specimens during the acute  phase of infection. The expected result is Negative. Fact Sheet for Patients:  StrictlyIdeas.no Fact Sheet for Healthcare Providers: BankingDealers.co.za This test is not yet approved or cleared by the [place] FDA and has been authorized for  detection and/or diagnosis of SARS-CoV-2 by FDA under an Emergency Use Authorization (EUA).  This EUA will remain in effect (meaning this test can be used) for the duration of the COVID-19 declaration under Section 564(b)(1) of the Act, 21 U.S.C. section 360bbb-3(b)(1), unless the authorization is terminated or revoked sooner. Performed at [facility], [address], [place], [place] [address]   MRSA PCR Screening     Status: None   Collection Time: 05/20/19  4:39 AM   Specimen: Nasal Mucosa; Nasopharyngeal  Result Value Ref Range Status   MRSA by PCR NEGATIVE NEGATIVE Final    Comment:        The GeneXpert MRSA Assay (FDA approved for NASAL specimens only), is one component of a comprehensive MRSA colonization surveillance program. It is not intended to diagnose MRSA infection nor to guide or monitor treatment for MRSA infections. Performed at [facility], [place] [address] [place], [place] [address]     Radiology Reports Dg Chest Portable 1 View  Result Date: 05/18/2019 CLINICAL DATA:  58 year old male recently diagnosed with COVID-19. Shortness of breath. EXAM: PORTABLE CHEST 1 VIEW COMPARISON:  Person toggle case portable chest 05/16/2019. FINDINGS: Portable AP upright view at 2137 hours. Increasing multifocal bilateral confluent but indistinct pulmonary opacity, more pronounced in the right lung. Stable somewhat low lung volumes. No pneumothorax or pleural effusion. Mediastinal contours and tracheal air column remain within normal limits. Negative visible bowel gas. No acute osseous abnormality identified. IMPRESSION: Increasing bilateral airspace opacity since 05/16/2019 compatible with progressive bilateral COVID-19 pneumonia in this clinical setting. Electronically Signed   By: Genevie Ann M.D.   On: 05/18/2019 21:56

## 2019-05-20 NOTE — Progress Notes (Addendum)
Patient reports not having had BM for over a week. Provider notified and bowel regimen requested. Bowel sounds hypoactive, patient passing gas. Awaiting response.

## 2019-05-21 LAB — CBC WITH DIFFERENTIAL/PLATELET
Abs Immature Granulocytes: 0.21 10*3/uL — ABNORMAL HIGH (ref 0.00–0.07)
Basophils Absolute: 0 10*3/uL (ref 0.0–0.1)
Basophils Relative: 0 %
Eosinophils Absolute: 0 10*3/uL (ref 0.0–0.5)
Eosinophils Relative: 0 %
HCT: 41.7 % (ref 39.0–52.0)
Hemoglobin: 13.8 g/dL (ref 13.0–17.0)
Immature Granulocytes: 2 %
Lymphocytes Relative: 9 %
Lymphs Abs: 1 10*3/uL (ref 0.7–4.0)
MCH: 29.4 pg (ref 26.0–34.0)
MCHC: 33.1 g/dL (ref 30.0–36.0)
MCV: 88.9 fL (ref 80.0–100.0)
Monocytes Absolute: 0.5 10*3/uL (ref 0.1–1.0)
Monocytes Relative: 5 %
Neutro Abs: 8.9 10*3/uL — ABNORMAL HIGH (ref 1.7–7.7)
Neutrophils Relative %: 84 %
Platelets: 314 10*3/uL (ref 150–400)
RBC: 4.69 MIL/uL (ref 4.22–5.81)
RDW: 12.5 % (ref 11.5–15.5)
WBC: 10.7 10*3/uL — ABNORMAL HIGH (ref 4.0–10.5)
nRBC: 0 % (ref 0.0–0.2)

## 2019-05-21 LAB — COMPREHENSIVE METABOLIC PANEL
ALT: 28 U/L (ref 0–44)
AST: 30 U/L (ref 15–41)
Albumin: 2.9 g/dL — ABNORMAL LOW (ref 3.5–5.0)
Alkaline Phosphatase: 34 U/L — ABNORMAL LOW (ref 38–126)
Anion gap: 12 (ref 5–15)
BUN: 20 mg/dL (ref 6–20)
CO2: 25 mmol/L (ref 22–32)
Calcium: 8.7 mg/dL — ABNORMAL LOW (ref 8.9–10.3)
Chloride: 104 mmol/L (ref 98–111)
Creatinine, Ser: 1 mg/dL (ref 0.61–1.24)
GFR calc Af Amer: >60 mL/min (ref >60)
GFR calc non Af Amer: >60 mL/min (ref >60)
Glucose, Bld: 151 mg/dL — ABNORMAL HIGH (ref 70–99)
Potassium: 4.3 mmol/L (ref 3.5–5.1)
Sodium: 141 mmol/L (ref 135–145)
Total Bilirubin: 0.8 mg/dL (ref 0.3–1.2)
Total Protein: 6.9 g/dL (ref 6.5–8.1)

## 2019-05-21 LAB — FERRITIN: Ferritin: 504 ng/mL — ABNORMAL HIGH (ref 24–336)

## 2019-05-21 LAB — C-REACTIVE PROTEIN: CRP: 3.4 mg/dL — ABNORMAL HIGH (ref <1.0)

## 2019-05-21 LAB — D-DIMER, QUANTITATIVE: D-Dimer, Quant: 0.51 ug/mL-FEU — ABNORMAL HIGH (ref 0.00–0.50)

## 2019-05-21 NOTE — Plan of Care (Signed)

## 2019-05-21 NOTE — Evaluation (Signed)
Physical Therapy Evaluation Patient Details Name: Philip Reed MRN: [id number] DOB: Apr 11, 1961 Today's Date: 05/21/2019   History of Present Illness  58 y/o male w/ hx of COVID, HEP C, Prostate cancer, former smoker presented w/ c/o SOB  Clinical Impression   Pt admitted with above diagnosis. PTA pt was independent and working. At therapist arrival pt was in bathroom bathing on his own, he is on room air throughout assessment. He was able to ambulate approx 217ft with no AD and SBA, noticed increasing labored breathing on last 171ft back to room. While ambulating was able to maintain 02 sats in high 80 and 90s, measured on Nelcor finger probe. Once back in room desat to 83% and needed cues for breathing to regain oxygenation. Pt currently with functional limitations due to the deficits in strength and activity tolerance. Pt will benefit from skilled PT to increase overall strength, balance and coordination, activity tolerance, independence and safety with mobility to allow discharge to the venue listed below.       Follow Up Recommendations No PT follow up(at d/c)    Equipment Recommendations  None recommended by PT    Recommendations for Other Services       Precautions / Restrictions Precautions Precautions: Fall Restrictions Weight Bearing Restrictions: No      Mobility  Bed Mobility Overal bed mobility: Modified Independent                Transfers Overall transfer level: Modified independent Equipment used: None                Ambulation/Gait Ambulation/Gait assistance: Supervision Gait Distance (Feet): 200 Feet Assistive device: None Gait Pattern/deviations: WFL(Within Functional Limits) Gait velocity: normal   General Gait Details: Ambulated down hall with no AD and on room air, pt agreeable to continuing to ambulate at each time therapist asks if needs to return to room, but noted that towards end of hall is having increased laboured breathing. By  time returned to room 02 sats 83% measured on nelcor finger probe. Cues given for breathing and within 1 minute able to recover to 88% (fluctuates between 90-88%).  Stairs            Wheelchair Mobility    Modified Rankin (Stroke Patients Only)       Balance Overall balance assessment: Modified Independent                                           Pertinent Vitals/Pain Pain Assessment: No/denies pain    Home Living Family/patient expects to be discharged to:: [facility]                 Additional Comments: states he may be on quarantine after d/c    Prior Function Level of Independence: Independent               Hand Dominance        Extremity/Trunk Assessment   Upper Extremity Assessment Upper Extremity Assessment: Overall WFL for tasks assessed    Lower Extremity Assessment Lower Extremity Assessment: Overall WFL for tasks assessed       Communication   Communication: No difficulties  Cognition Arousal/Alertness: Awake/alert Behavior During Therapy: WFL for tasks assessed/performed Overall Cognitive Status: Within Functional Limits for tasks assessed  General Comments General comments (skin integrity, edema, etc.): Pt admitted w/ SOB, he had been seen by medic prior and dx w/ COVID but had been d/c to camp again prior to return to ED with significant SOB. Today he is found in rest room bathing on his own, he is on room air. He was able to ambulate approx 279ft with no AD and SBA and maintain sats in high 80s and into high 90s throughout. once completed ambulation desat to 83%, with cues was able to recover within 1 minute.          Exercises     Assessment/Plan    PT Assessment Patient needs continued PT services(while in hospital to address activity tolerance)  PT Problem List Decreased safety awareness;Decreased activity tolerance;Decreased strength        PT Treatment Interventions Gait training;Functional mobility training;Therapeutic activities;Therapeutic exercise;Neuromuscular re-education    PT Goals (Current goals can be found in the Care Plan section)  Acute Rehab PT Goals Time For Goal Achievement: 06/04/19 Potential to Achieve Goals: Good    Frequency Min 3X/week   Barriers to discharge        Co-evaluation               AM-PAC PT "6 Clicks" Mobility  Outcome Measure Help needed turning from your back to your side while in a flat bed without using bedrails?: None Help needed moving from lying on your back to sitting on the side of a flat bed without using bedrails?: None Help needed moving to and from a bed to a chair (including a wheelchair)?: None Help needed standing up from a chair using your arms (e.g., wheelchair or bedside chair)?: None Help needed to walk in hospital room?: A Little Help needed climbing 3-5 steps with a railing? : A Little 6 Click Score: 22    End of Session   Activity Tolerance: Treatment limited secondary to medical complications (Comment);Patient limited by fatigue Patient left: in bed;with call bell/phone within reach Nurse Communication: Mobility status PT Visit Diagnosis: Other abnormalities of gait and mobility (R26.89);Muscle weakness (generalized) (M62.81)    Time: [id number] PT Time Calculation (min) (ACUTE ONLY): 16 min   Charges:   PT Evaluation $PT Eval Low Complexity: Prentiss, PT   Delford Field 05/21/2019, 12:47 PM

## 2019-05-21 NOTE — Progress Notes (Signed)
PROGRESS NOTE                                                                                                                                                                                                             Patient Demographics:    Philip Reed, is a 58 y.o. male, DOB - 06-11-1961, [id number]  Outpatient Primary MD for the patient is Patient, No Pcp Per   Admit date - 05/18/2019   LOS - 2  Chief Complaint  Patient presents with   Shortness of Breath       Brief Narrative: Patient is a 58 y.o. male with PMHx of  hepatitis C (per patient treated), prostate cancer s/p radiation therapy-incarcerated at [facility] diagnosed with COVID 19 approximately one week at a outside facility-presenting with worsening SOB-found to have acute hypoxic respiratory failure secondary to COVID 19 PNA. See below for further details.    Subjective:    Philip Reed feels much better-on room air.   Assessment  & Plan :   Acute Hypoxic Resp Failure due to Covid 19 Viral pneumonia: Improved-on room air-continue steroids and remdesivir.   Fever: afebrile  O2 requirements: On RA  COVID-19 Labs: Recent Labs    05/19/19 0903 05/19/19 0906 05/19/19 1427 05/20/19 0418 05/21/19 0531  DDIMER 0.88*  --   --  0.67* 0.51*  FERRITIN  --  549*  --  462* 504*  LDH  --   --  370*  --   --   CRP  --  9.2*  --  7.1* 3.4*    Lab Results  Component Value Date   SARSCOV2NAA POSITIVE (A) 05/19/2019     COVID-19 Medications: Steroids:10/14>> Remdesivir:10/15>> Actemra: given hx of latent TB-contraindicated Convalescent Plasma:Not indicated-will give if hypoxia worsens Research Studies:N/A  Other medications: Diuretics:Euvolemic-no need for lasix Antibiotics:Not needed as no evidence of bacterial infection  Prone/Incentive Spirometry: encouraged patient to lie prone for 3-4 hours at a time for a total of 16 hours a day, and to  encourage incentive spirometry use 3-4/hour.  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  History of prostate cancer s/p radiation therapy  ABG: No results found for: PHART, PCO2ART, PO2ART, HCO3, TCO2, ACIDBASEDEF, O2SAT  Vent Settings: N/A  Condition - Stable  Family  Communication  : None available at bedside  Code Status :  Full Code  Diet :  Diet Order            Diet regular Room service appropriate? Yes; Fluid consistency: Thin  Diet effective now               Disposition Plan  :  Remain hospitalized  Barriers to discharge: Needs to complete 5 days of IV remdesivir.  Consults  :  None  Procedures  :  None  Antibiotics  :    Anti-infectives (From admission, onward)   Start     Dose/Rate Route Frequency Ordered Stop   05/20/19 1000  remdesivir 100 mg in sodium chloride 0.9 % 250 mL IVPB     100 mg 500 mL/hr over 30 Minutes Intravenous Every 24 hours 05/19/19 1100 05/24/19 0959   05/20/19 0700  remdesivir 100 mg in sodium chloride 0.9 % 250 mL IVPB  Status:  Discontinued     100 mg 500 mL/hr over 30 Minutes Intravenous Every 24 hours 05/19/19 0646 05/19/19 1059   05/19/19 1200  remdesivir 200 mg in sodium chloride 0.9 % 250 mL IVPB     200 mg 500 mL/hr over 30 Minutes Intravenous Once 05/19/19 1100 05/19/19 1230   05/19/19 0645  remdesivir 200 mg in sodium chloride 0.9 % 250 mL IVPB  Status:  Discontinued     200 mg 500 mL/hr over 30 Minutes Intravenous Once 05/19/19 0646 05/19/19 1059      Inpatient Medications  Scheduled Meds:  enoxaparin (LOVENOX) injection  55 mg Subcutaneous Q24H   methylPREDNISolone (SOLU-MEDROL) injection  60 mg Intravenous Q12H   pantoprazole  40 mg Oral Daily   Continuous Infusions:  sodium chloride Stopped (05/19/19 1741)   remdesivir 100 mg in NS 250 mL 100 mg (05/21/19 1009)   PRN Meds:.acetaminophen, albuterol, ondansetron **OR** ondansetron (ZOFRAN)  IV   Time Spent in minutes  25  See all Orders from today for further details   Oren Binet M.D on 05/21/2019 at 11:24 AM  To page go to [email or web address] - use universal password  [facility] -  Office  [phone]    Objective:   Vitals:   05/20/19 1929 05/20/19 2305 05/21/19 0413 05/21/19 0805  BP: 96/65  (!) 92/57 104/72  Pulse: 93  74 79  Resp: 17  19 18   Temp: 97.8 F (36.6 C)  97.9 F (36.6 C) 97.9 F (36.6 C)  TempSrc: Oral  Oral Oral  SpO2: 100% 98% 94% 95%  Weight:      Height:        Wt Readings from Last 3 Encounters:  05/19/19 108.9 kg     Intake/Output Summary (Last 24 hours) at 05/21/2019 1124 Last data filed at 05/21/2019 0808 Gross per 24 hour  Intake 610 ml  Output 1025 ml  Net -415 ml     Physical Exam Gen Exam:Alert awake-not in any distress HEENT:atraumatic, normocephalic Chest: B/L clear to auscultation anteriorly CVS:S1S2 regular Abdomen:soft non tender, non distended Extremities:no edema Neurology: Non focal Skin: no rash   Data Review:    CBC Recent Labs  Lab 05/18/19 2223 05/19/19 0903 05/19/19 1427 05/20/19 0418 05/21/19 0531  WBC 7.2 6.1 7.2 10.3 10.7*  HGB 14.0 14.1 13.9 13.8 13.8  HCT 43.6 43.2 39.7 41.5 41.7  PLT 236 264 267 311 314  MCV 90.5 91.1 87.4 88.3 88.9  MCH 29.0 29.7 30.6 29.4 29.4  MCHC 32.1 32.6 35.0 33.3 33.1  RDW 12.4 12.2 12.3 12.4 12.5  LYMPHSABS 1.1  --   --  0.8 1.0  MONOABS 0.5  --   --  0.5 0.5  EOSABS 0.0  --   --  0.0 0.0  BASOSABS 0.0  --   --  0.0 0.0    Chemistries  Recent Labs  Lab 05/18/19 2223 05/19/19 0903 05/19/19 1427 05/20/19 0418 05/21/19 0531  NA 134* 137  --  136 141  K 3.8 4.0  --  4.4 4.3  CL 101 103  --  103 104  CO2 20* 22  --  23 25  GLUCOSE 114* 150*  --  161* 151*  BUN 10 12  --  16 20  CREATININE 1.13 0.96 1.15 1.06 1.00  CALCIUM 8.0* 8.5*  --  8.8* 8.7*  AST 42* 40  --  47* 30  ALT 23 23  --  30 28  ALKPHOS 30* 30*  --  29* 34*  BILITOT  1.3* 0.9  --  0.7 0.8   ------------------------------------------------------------------------------------------------------------------ No results for input(s): CHOL, HDL, LDLCALC, TRIG, CHOLHDL, LDLDIRECT in the last 72 hours.  No results found for: HGBA1C ------------------------------------------------------------------------------------------------------------------ No results for input(s): TSH, T4TOTAL, T3FREE, THYROIDAB in the last 72 hours.  Invalid input(s): FREET3 ------------------------------------------------------------------------------------------------------------------ Recent Labs    05/20/19 0418 05/21/19 0531  FERRITIN 462* 504*    Coagulation profile No results for input(s): INR, PROTIME in the last 168 hours.  Recent Labs    05/20/19 0418 05/21/19 0531  DDIMER 0.67* 0.51*    Cardiac Enzymes No results for input(s): CKMB, TROPONINI, MYOGLOBIN in the last 168 hours.  Invalid input(s): CK ------------------------------------------------------------------------------------------------------------------    Component Value Date/Time   BNP 24.0 05/19/2019 0906    Micro Results Recent Results (from the past 240 hour(s))  SARS Coronavirus 2 by RT PCR (hospital order, performed in [facility] hospital lab) Nasopharyngeal Nasopharyngeal Swab     Status: Abnormal   Collection Time: 05/19/19 12:27 AM   Specimen: Nasopharyngeal Swab  Result Value Ref Range Status   SARS Coronavirus 2 POSITIVE (A) NEGATIVE Final    Comment: RESULT CALLED TO, READ BACK BY AND VERIFIED WITH: T EASTER,RN@[id number]  05/19/19 MKELLY (NOTE) If result is NEGATIVE SARS-CoV-2 target nucleic acids are NOT DETECTED. The SARS-CoV-2 RNA is generally detectable in upper and lower  respiratory specimens during the acute phase of infection. The lowest  concentration of SARS-CoV-2 viral copies this assay can detect is 250  copies / mL. A negative result does not preclude SARS-CoV-2 infection    and should not be used as the sole basis for treatment or other  patient management decisions.  A negative result may occur with  improper specimen collection / handling, submission of specimen other  than nasopharyngeal swab, presence of viral mutation(s) within the  areas targeted by this assay, and inadequate number of viral copies  (<250 copies / mL). A negative result must be combined with clinical  observations, patient history, and epidemiological information. If result is POSITIVE SARS-CoV-2 target nucleic acids are DETECTED. The  SARS-CoV-2 RNA is generally detectable in upper and lower  respiratory specimens during the acute phase of infection.  Positive  results are indicative of active infection with SARS-CoV-2.  Clinical  correlation with patient history and other diagnostic information is  necessary to determine patient infection status.  Positive results do  not rule out bacterial infection or co-infection with other viruses. If result is PRESUMPTIVE POSTIVE SARS-CoV-2 nucleic acids MAY BE PRESENT.  A presumptive positive result was obtained on the submitted specimen  and confirmed on repeat testing.  While 2019 novel coronavirus  (SARS-CoV-2) nucleic acids may be present in the submitted sample  additional confirmatory testing may be necessary for epidemiological  and / or clinical management purposes  to differentiate between  SARS-CoV-2 and other Sarbecovirus currently known to infect humans.  If clinically indicated additional testing with an alternate test  methodology [phone]) is ad vised. The SARS-CoV-2 RNA is generally  detectable in upper and lower respiratory specimens during the acute  phase of infection. The expected result is Negative. Fact Sheet for Patients:  StrictlyIdeas.no Fact Sheet for Healthcare Providers: BankingDealers.co.za This test is not yet approved or cleared by the [place] FDA  and has been authorized for detection and/or diagnosis of SARS-CoV-2 by FDA under an Emergency Use Authorization (EUA).  This EUA will remain in effect (meaning this test can be used) for the duration of the COVID-19 declaration under Section 564(b)(1) of the Act, 21 U.S.C. section 360bbb-3(b)(1), unless the authorization is terminated or revoked sooner. Performed at [facility], [address], [place], [place] [address]   MRSA PCR Screening     Status: None   Collection Time: 05/20/19  4:39 AM   Specimen: Nasal Mucosa; Nasopharyngeal  Result Value Ref Range Status   MRSA by PCR NEGATIVE NEGATIVE Final    Comment:        The GeneXpert MRSA Assay (FDA approved for NASAL specimens only), is one component of a comprehensive MRSA colonization surveillance program. It is not intended to diagnose MRSA infection nor to guide or monitor treatment for MRSA infections. Performed at [facility], [place] [address], [place], [place] [address]     Radiology Reports Dg Chest Portable 1 View  Result Date: 05/18/2019 CLINICAL DATA:  58 year old male recently diagnosed with COVID-19. Shortness of breath. EXAM: PORTABLE CHEST 1 VIEW COMPARISON:  Person toggle case portable chest 05/16/2019. FINDINGS: Portable AP upright view at 2137 hours. Increasing multifocal bilateral confluent but indistinct pulmonary opacity, more pronounced in the right lung. Stable somewhat low lung volumes. No pneumothorax or pleural effusion. Mediastinal contours and tracheal air column remain within normal limits. Negative visible bowel gas. No acute osseous abnormality identified. IMPRESSION: Increasing bilateral airspace opacity since 05/16/2019 compatible with progressive bilateral COVID-19 pneumonia in this clinical setting. Electronically Signed   By: Genevie Ann M.D.   On: 05/18/2019 21:56

## 2019-05-22 LAB — CBC WITH DIFFERENTIAL/PLATELET
Abs Immature Granulocytes: 0.19 10*3/uL — ABNORMAL HIGH (ref 0.00–0.07)
Basophils Absolute: 0.1 10*3/uL (ref 0.0–0.1)
Basophils Relative: 1 %
Eosinophils Absolute: 0 10*3/uL (ref 0.0–0.5)
Eosinophils Relative: 0 %
HCT: 41.7 % (ref 39.0–52.0)
Hemoglobin: 13.8 g/dL (ref 13.0–17.0)
Immature Granulocytes: 2 %
Lymphocytes Relative: 9 %
Lymphs Abs: 0.9 10*3/uL (ref 0.7–4.0)
MCH: 29.5 pg (ref 26.0–34.0)
MCHC: 33.1 g/dL (ref 30.0–36.0)
MCV: 89.1 fL (ref 80.0–100.0)
Monocytes Absolute: 0.7 10*3/uL (ref 0.1–1.0)
Monocytes Relative: 6 %
Neutro Abs: 8.4 10*3/uL — ABNORMAL HIGH (ref 1.7–7.7)
Neutrophils Relative %: 82 %
Platelets: 344 10*3/uL (ref 150–400)
RBC: 4.68 MIL/uL (ref 4.22–5.81)
RDW: 12.7 % (ref 11.5–15.5)
WBC: 10.2 10*3/uL (ref 4.0–10.5)
nRBC: 0 % (ref 0.0–0.2)

## 2019-05-22 LAB — FERRITIN: Ferritin: 436 ng/mL — ABNORMAL HIGH (ref 24–336)

## 2019-05-22 LAB — COMPREHENSIVE METABOLIC PANEL
ALT: 22 U/L (ref 0–44)
AST: 23 U/L (ref 15–41)
Albumin: 2.8 g/dL — ABNORMAL LOW (ref 3.5–5.0)
Alkaline Phosphatase: 39 U/L (ref 38–126)
Anion gap: 9 (ref 5–15)
BUN: 18 mg/dL (ref 6–20)
CO2: 25 mmol/L (ref 22–32)
Calcium: 8.7 mg/dL — ABNORMAL LOW (ref 8.9–10.3)
Chloride: 104 mmol/L (ref 98–111)
Creatinine, Ser: 0.91 mg/dL (ref 0.61–1.24)
GFR calc Af Amer: >60 mL/min (ref >60)
GFR calc non Af Amer: >60 mL/min (ref >60)
Glucose, Bld: 135 mg/dL — ABNORMAL HIGH (ref 70–99)
Potassium: 4.7 mmol/L (ref 3.5–5.1)
Sodium: 138 mmol/L (ref 135–145)
Total Bilirubin: 0.6 mg/dL (ref 0.3–1.2)
Total Protein: 6.5 g/dL (ref 6.5–8.1)

## 2019-05-22 LAB — D-DIMER, QUANTITATIVE: D-Dimer, Quant: 0.6 ug/mL-FEU — ABNORMAL HIGH (ref 0.00–0.50)

## 2019-05-22 LAB — C-REACTIVE PROTEIN: CRP: 1.5 mg/dL — ABNORMAL HIGH (ref <1.0)

## 2019-05-22 NOTE — Progress Notes (Signed)
PROGRESS NOTE                                                                                                                                                                                                             Patient Demographics:    Philip Reed, is a 58 y.o. male, DOB - 09-08-1960, [id number]  Outpatient Primary MD for the patient is Patient, No Pcp Per   Admit date - 05/18/2019   LOS - 3  Chief Complaint  Patient presents with  . Shortness of Breath       Brief Narrative: Patient is a 58 y.o. male with PMHx of  hepatitis C (per patient treated), prostate cancer s/p radiation therapy-incarcerated at [place] diagnosed with COVID 19 approximately one week at a outside facility-presenting with worsening SOB-found to have acute hypoxic respiratory failure secondary to COVID 19 PNA. See below for further details.    Subjective:  Lying comfortably in bed-claims he feels better-was put on oxygen at night-around 1-2 L.   Assessment  & Plan :   Acute Hypoxic Resp Failure due to Covid 19 Viral pneumonia: Overall much improved-plans are to continue with steroids and remdesivir.  Will need to assess for home O2 prior to discharge.    Fever: afebrile  O2 requirements: On 1-2 L of oxygen this morning.  COVID-19 Labs: Recent Labs    05/19/19 1427 05/20/19 0418 05/21/19 0531 05/22/19 0516  DDIMER  --  0.67* 0.51* 0.60*  FERRITIN  --  462* 504* 436*  LDH 370*  --   --   --   CRP  --  7.1* 3.4* 1.5*    Lab Results  Component Value Date   SARSCOV2NAA POSITIVE (A) 05/19/2019     COVID-19 Medications: Steroids:10/14>> Remdesivir:10/15>> Actemra: given hx of latent TB-contraindicated Convalescent Plasma:Not indicated-will give if hypoxia worsens Research Studies:N/A  Other medications: Diuretics:Euvolemic-no need for lasix Antibiotics:Not needed as no evidence of bacterial infection  Prone/Incentive  Spirometry: encouraged patient to lie prone for 3-4 hours at a time for a total of 16 hours a day, and to encourage incentive spirometry use 3-4/hour.  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  History of prostate cancer s/p radiation therapy  ABG: No results found for: PHART, PCO2ART, PO2ART, HCO3, TCO2,  ACIDBASEDEF, O2SAT  Vent Settings: N/A  Condition - Stable  Family Communication  : None available at bedside  Code Status :  Full Code  Diet :  Diet Order            Diet regular Room service appropriate? Yes; Fluid consistency: Thin  Diet effective now               Disposition Plan  :  Remain hospitalized  Barriers to discharge: Needs to complete 5 days of IV remdesivir.  Consults  :  None  Procedures  :  None  Antibiotics  :    Anti-infectives (From admission, onward)   Start     Dose/Rate Route Frequency Ordered Stop   05/20/19 1000  remdesivir 100 mg in sodium chloride 0.9 % 250 mL IVPB     100 mg 500 mL/hr over 30 Minutes Intravenous Every 24 hours 05/19/19 1100 05/24/19 0959   05/20/19 0700  remdesivir 100 mg in sodium chloride 0.9 % 250 mL IVPB  Status:  Discontinued     100 mg 500 mL/hr over 30 Minutes Intravenous Every 24 hours 05/19/19 0646 05/19/19 1059   05/19/19 1200  remdesivir 200 mg in sodium chloride 0.9 % 250 mL IVPB     200 mg 500 mL/hr over 30 Minutes Intravenous Once 05/19/19 1100 05/19/19 1230   05/19/19 0645  remdesivir 200 mg in sodium chloride 0.9 % 250 mL IVPB  Status:  Discontinued     200 mg 500 mL/hr over 30 Minutes Intravenous Once 05/19/19 0646 05/19/19 1059      Inpatient Medications  Scheduled Meds: . enoxaparin (LOVENOX) injection  55 mg Subcutaneous Q24H  . methylPREDNISolone (SOLU-MEDROL) injection  60 mg Intravenous Q12H  . pantoprazole  40 mg Oral Daily   Continuous Infusions: . sodium chloride Stopped (05/19/19 1741)  . remdesivir 100 mg in NS 250 mL  Stopped (05/21/19 1039)   PRN Meds:.acetaminophen, albuterol, ondansetron **OR** ondansetron (ZOFRAN) IV   Time Spent in minutes  25  See all Orders from today for further details   Oren Binet M.D on 05/22/2019 at 9:54 AM  To page go to [email or web address] - use universal password  [facility] -  Office  [phone]    Objective:   Vitals:   05/22/19 0105 05/22/19 0535 05/22/19 0741 05/22/19 0858  BP: 115/73 126/79 103/68   Pulse: (!) 57 62 79   Resp:   19   Temp: 98.5 F (36.9 C) 98.7 F (37.1 C) 98 F (36.7 C)   TempSrc: Oral Oral Oral   SpO2: 90% 92% 95% (!) 85%  Weight:  106.7 kg    Height:        Wt Readings from Last 3 Encounters:  05/22/19 106.7 kg     Intake/Output Summary (Last 24 hours) at 05/22/2019 0954 Last data filed at 05/22/2019 0500 Gross per 24 hour  Intake 610 ml  Output 1150 ml  Net -540 ml     Physical Exam Gen Exam:Alert awake-not in any distress HEENT:atraumatic, normocephalic Chest: B/L clear to auscultation anteriorly CVS:S1S2 regular Abdomen:soft non tender, non distended Extremities:no edema Neurology: Non focal Skin: no rash   Data Review:    CBC Recent Labs  Lab 05/18/19 2223 05/19/19 0903 05/19/19 1427 05/20/19 0418 05/21/19 0531 05/22/19 0516  WBC 7.2 6.1 7.2 10.3 10.7* 10.2  HGB 14.0 14.1 13.9 13.8 13.8 13.8  HCT 43.6 43.2 39.7 41.5 41.7 41.7  PLT 236 264 267 311 314 344  MCV  90.5 91.1 87.4 88.3 88.9 89.1  MCH 29.0 29.7 30.6 29.4 29.4 29.5  MCHC 32.1 32.6 35.0 33.3 33.1 33.1  RDW 12.4 12.2 12.3 12.4 12.5 12.7  LYMPHSABS 1.1  --   --  0.8 1.0 0.9  MONOABS 0.5  --   --  0.5 0.5 0.7  EOSABS 0.0  --   --  0.0 0.0 0.0  BASOSABS 0.0  --   --  0.0 0.0 0.1    Chemistries  Recent Labs  Lab 05/18/19 2223 05/19/19 0903 05/19/19 1427 05/20/19 0418 05/21/19 0531 05/22/19 0516  NA 134* 137  --  136 141 138  K 3.8 4.0  --  4.4 4.3 4.7  CL 101 103  --  103 104 104  CO2 20* 22  --  23 25 25   GLUCOSE  114* 150*  --  161* 151* 135*  BUN 10 12  --  16 20 18   CREATININE 1.13 0.96 1.15 1.06 1.00 0.91  CALCIUM 8.0* 8.5*  --  8.8* 8.7* 8.7*  AST 42* 40  --  47* 30 23  ALT 23 23  --  30 28 22   ALKPHOS 30* 30*  --  29* 34* 39  BILITOT 1.3* 0.9  --  0.7 0.8 0.6   ------------------------------------------------------------------------------------------------------------------ No results for input(s): CHOL, HDL, LDLCALC, TRIG, CHOLHDL, LDLDIRECT in the last 72 hours.  No results found for: HGBA1C ------------------------------------------------------------------------------------------------------------------ No results for input(s): TSH, T4TOTAL, T3FREE, THYROIDAB in the last 72 hours.  Invalid input(s): FREET3 ------------------------------------------------------------------------------------------------------------------ Recent Labs    05/21/19 0531 05/22/19 0516  FERRITIN 504* 436*    Coagulation profile No results for input(s): INR, PROTIME in the last 168 hours.  Recent Labs    05/21/19 0531 05/22/19 0516  DDIMER 0.51* 0.60*    Cardiac Enzymes No results for input(s): CKMB, TROPONINI, MYOGLOBIN in the last 168 hours.  Invalid input(s): CK ------------------------------------------------------------------------------------------------------------------    Component Value Date/Time   BNP 24.0 05/19/2019 0906    Micro Results Recent Results (from the past 240 hour(s))  SARS Coronavirus 2 by RT PCR (hospital order, performed in [facility] hospital lab) Nasopharyngeal Nasopharyngeal Swab     Status: Abnormal   Collection Time: 05/19/19 12:27 AM   Specimen: Nasopharyngeal Swab  Result Value Ref Range Status   SARS Coronavirus 2 POSITIVE (A) NEGATIVE Final    Comment: RESULT CALLED TO, READ BACK BY AND VERIFIED WITH: T EASTER,RN@[id number]  05/19/19 MKELLY (NOTE) If result is NEGATIVE SARS-CoV-2 target nucleic acids are NOT DETECTED. The SARS-CoV-2 RNA is generally  detectable in upper and lower  respiratory specimens during the acute phase of infection. The lowest  concentration of SARS-CoV-2 viral copies this assay can detect is 250  copies / mL. A negative result does not preclude SARS-CoV-2 infection  and should not be used as the sole basis for treatment or other  patient management decisions.  A negative result may occur with  improper specimen collection / handling, submission of specimen other  than nasopharyngeal swab, presence of viral mutation(s) within the  areas targeted by this assay, and inadequate number of viral copies  (<250 copies / mL). A negative result must be combined with clinical  observations, patient history, and epidemiological information. If result is POSITIVE SARS-CoV-2 target nucleic acids are DETECTED. The  SARS-CoV-2 RNA is generally detectable in upper and lower  respiratory specimens during the acute phase of infection.  Positive  results are indicative of active infection with SARS-CoV-2.  Clinical  correlation with patient  history and other diagnostic information is  necessary to determine patient infection status.  Positive results do  not rule out bacterial infection or co-infection with other viruses. If result is PRESUMPTIVE POSTIVE SARS-CoV-2 nucleic acids MAY BE PRESENT.   A presumptive positive result was obtained on the submitted specimen  and confirmed on repeat testing.  While 2019 novel coronavirus  (SARS-CoV-2) nucleic acids may be present in the submitted sample  additional confirmatory testing may be necessary for epidemiological  and / or clinical management purposes  to differentiate between  SARS-CoV-2 and other Sarbecovirus currently known to infect humans.  If clinically indicated additional testing with an alternate test  methodology [phone]) is ad vised. The SARS-CoV-2 RNA is generally  detectable in upper and lower respiratory specimens during the acute  phase of infection. The  expected result is Negative. Fact Sheet for Patients:  StrictlyIdeas.no Fact Sheet for Healthcare Providers: BankingDealers.co.za This test is not yet approved or cleared by the [place] FDA and has been authorized for detection and/or diagnosis of SARS-CoV-2 by FDA under an Emergency Use Authorization (EUA).  This EUA will remain in effect (meaning this test can be used) for the duration of the COVID-19 declaration under Section 564(b)(1) of the Act, 21 U.S.C. section 360bbb-3(b)(1), unless the authorization is terminated or revoked sooner. Performed at [facility], [address] [place], [place] [address]   MRSA PCR Screening     Status: None   Collection Time: 05/20/19  4:39 AM   Specimen: Nasal Mucosa; Nasopharyngeal  Result Value Ref Range Status   MRSA by PCR NEGATIVE NEGATIVE Final    Comment:        The GeneXpert MRSA Assay (FDA approved for NASAL specimens only), is one component of a comprehensive MRSA colonization surveillance program. It is not intended to diagnose MRSA infection nor to guide or monitor treatment for MRSA infections. Performed at [facility], [place] [address], [place], [place] [address]     Radiology Reports Dg Chest Portable 1 View  Result Date: 05/18/2019 CLINICAL DATA:  58 year old male recently diagnosed with COVID-19. Shortness of breath. EXAM: PORTABLE CHEST 1 VIEW COMPARISON:  Person toggle case portable chest 05/16/2019. FINDINGS: Portable AP upright view at 2137 hours. Increasing multifocal bilateral confluent but indistinct pulmonary opacity, more pronounced in the right lung. Stable somewhat low lung volumes. No pneumothorax or pleural effusion. Mediastinal contours and tracheal air column remain within normal limits. Negative visible bowel gas. No acute osseous abnormality identified. IMPRESSION: Increasing bilateral airspace opacity since 05/16/2019 compatible with  progressive bilateral COVID-19 pneumonia in this clinical setting. Electronically Signed   By: Genevie Ann M.D.   On: 05/18/2019 21:56

## 2019-05-22 NOTE — Progress Notes (Signed)
Ambulated patient ~229ft. In the hallway. Pt required 6L O2 while ambulating to maintain sats >88%. After walking pt back to chair, recovered to 92% on 4L [place], will continue to titrate as patient tolerates.

## 2019-05-22 NOTE — Plan of Care (Signed)

## 2019-05-23 ENCOUNTER — Inpatient Hospital Stay (HOSPITAL_COMMUNITY)

## 2019-05-23 LAB — CBC WITH DIFFERENTIAL/PLATELET
Abs Immature Granulocytes: 0.14 10*3/uL — ABNORMAL HIGH (ref 0.00–0.07)
Basophils Absolute: 0 10*3/uL (ref 0.0–0.1)
Basophils Relative: 0 %
Eosinophils Absolute: 0 10*3/uL (ref 0.0–0.5)
Eosinophils Relative: 0 %
HCT: 42 % (ref 39.0–52.0)
Hemoglobin: 13.9 g/dL (ref 13.0–17.0)
Immature Granulocytes: 1 %
Lymphocytes Relative: 9 %
Lymphs Abs: 0.9 10*3/uL (ref 0.7–4.0)
MCH: 29.7 pg (ref 26.0–34.0)
MCHC: 33.1 g/dL (ref 30.0–36.0)
MCV: 89.7 fL (ref 80.0–100.0)
Monocytes Absolute: 0.7 10*3/uL (ref 0.1–1.0)
Monocytes Relative: 7 %
Neutro Abs: 8.3 10*3/uL — ABNORMAL HIGH (ref 1.7–7.7)
Neutrophils Relative %: 83 %
Platelets: 368 10*3/uL (ref 150–400)
RBC: 4.68 MIL/uL (ref 4.22–5.81)
RDW: 12.6 % (ref 11.5–15.5)
WBC: 10.1 10*3/uL (ref 4.0–10.5)
nRBC: 0.2 % (ref 0.0–0.2)

## 2019-05-23 LAB — COMPREHENSIVE METABOLIC PANEL
ALT: 27 U/L (ref 0–44)
AST: 24 U/L (ref 15–41)
Albumin: 2.8 g/dL — ABNORMAL LOW (ref 3.5–5.0)
Alkaline Phosphatase: 36 U/L — ABNORMAL LOW (ref 38–126)
Anion gap: 9 (ref 5–15)
BUN: 19 mg/dL (ref 6–20)
CO2: 25 mmol/L (ref 22–32)
Calcium: 8.6 mg/dL — ABNORMAL LOW (ref 8.9–10.3)
Chloride: 104 mmol/L (ref 98–111)
Creatinine, Ser: 0.91 mg/dL (ref 0.61–1.24)
GFR calc Af Amer: >60 mL/min (ref >60)
GFR calc non Af Amer: >60 mL/min (ref >60)
Glucose, Bld: 108 mg/dL — ABNORMAL HIGH (ref 70–99)
Potassium: 5 mmol/L (ref 3.5–5.1)
Sodium: 138 mmol/L (ref 135–145)
Total Bilirubin: 1 mg/dL (ref 0.3–1.2)
Total Protein: 6.4 g/dL — ABNORMAL LOW (ref 6.5–8.1)

## 2019-05-23 LAB — C-REACTIVE PROTEIN: CRP: 1 mg/dL — ABNORMAL HIGH (ref <1.0)

## 2019-05-23 LAB — D-DIMER, QUANTITATIVE: D-Dimer, Quant: 0.73 ug/mL-FEU — ABNORMAL HIGH (ref 0.00–0.50)

## 2019-05-23 LAB — FERRITIN: Ferritin: 410 ng/mL — ABNORMAL HIGH (ref 24–336)

## 2019-05-23 NOTE — Progress Notes (Signed)
Physical Therapy Treatment Patient Details Name: Philip Reed MRN: [id number] DOB: Nov 19, 1960 Today's Date: 05/23/2019    History of Present Illness 58 y/o male w/ hx of COVID, HEP C, Prostate cancer, former smoker presented w/ c/o SOB    PT Comments    Pt continues to have decr SpO2 with amb. 86% on 4L with amb.   Follow Up Recommendations  No PT follow up     Equipment Recommendations  None recommended by PT    Recommendations for Other Services       Precautions / Restrictions Precautions Precautions: Fall Restrictions Weight Bearing Restrictions: No    Mobility  Bed Mobility Overal bed mobility: Modified Independent                Transfers Overall transfer level: Modified independent Equipment used: None                Ambulation/Gait Ambulation/Gait assistance: Supervision Gait Distance (Feet): 300 Feet Assistive device: None Gait Pattern/deviations: WFL(Within Functional Limits) Gait velocity: normal Gait velocity interpretation: >2.62 ft/sec, indicative of community ambulatory General Gait Details: Pt on 4L for gait with SpO2 dropping to 86%. Returned to 90% after sitting and resting 2 minutes   Stairs             Wheelchair Mobility    Modified Rankin (Stroke Patients Only)       Balance Overall balance assessment: No apparent balance deficits (not formally assessed)                                          Cognition Arousal/Alertness: Awake/alert Behavior During Therapy: WFL for tasks assessed/performed Overall Cognitive Status: Within Functional Limits for tasks assessed                                        Exercises      General Comments        Pertinent Vitals/Pain Pain Assessment: No/denies pain    Home Living                      Prior Function            PT Goals (current goals can now be found in the care plan section) Progress towards PT goals:  Progressing toward goals    Frequency    Min 3X/week      PT Plan Current plan remains appropriate    Co-evaluation              AM-PAC PT "6 Clicks" Mobility   Outcome Measure  Help needed turning from your back to your side while in a flat bed without using bedrails?: None Help needed moving from lying on your back to sitting on the side of a flat bed without using bedrails?: None Help needed moving to and from a bed to a chair (including a wheelchair)?: None Help needed standing up from a chair using your arms (e.g., wheelchair or bedside chair)?: None Help needed to walk in hospital room?: A Little Help needed climbing 3-5 steps with a railing? : A Little 6 Click Score: 22    End of Session Equipment Utilized During Treatment: Oxygen Activity Tolerance: Treatment limited secondary to medical complications (Comment) Patient left: in bed;with call bell/phone within reach Nurse Communication:  Mobility status PT Visit Diagnosis: Other abnormalities of gait and mobility (R26.89);Muscle weakness (generalized) (M62.81)     Time: [id number] PT Time Calculation (min) (ACUTE ONLY): 14 min  Charges:  $Gait Training: 8-22 mins                     [place] Pager [phone] Office [place] 05/23/2019, 4:29 PM

## 2019-05-23 NOTE — Plan of Care (Signed)

## 2019-05-23 NOTE — Progress Notes (Signed)
PROGRESS NOTE                                                                                                                                                                                                             Patient Demographics:    Philip Reed, is a 58 y.o. male, DOB - 1960/12/21, [id number]  Outpatient Primary MD for the patient is Patient, No Pcp Per   Admit date - 05/18/2019   LOS - 4  Chief Complaint  Patient presents with  . Shortness of Breath       Brief Narrative: Patient is a 58 y.o. male with PMHx of  hepatitis C (per patient treated), prostate cancer s/p radiation therapy-incarcerated at [facility] diagnosed with COVID 19 approximately one week at a outside facility-presenting with worsening SOB-found to have acute hypoxic respiratory failure secondary to COVID 19 PNA. See below for further details.    Subjective:  Lying comfortably in bed-no major issues overnight-per nursing staff requires more O2 at night-back in 2-3 L of O2 this morning.   Assessment  & Plan :   Acute Hypoxic Resp Failure due to Covid 19 Viral pneumonia: Improvement continues, continue Steroids/Remdesivir-may require O2 on discharge.Continue to attempt to titrate off O2.  Will need to assess for home O2 prior to discharge.    Fever: afebrile  O2 requirements: On 2-3 L of oxygen this morning.  COVID-19 Labs: Recent Labs    05/21/19 0531 05/22/19 0516 05/23/19 0340  DDIMER 0.51* 0.60* 0.73*  FERRITIN 504* 436* 410*  CRP 3.4* 1.5* 1.0*    Lab Results  Component Value Date   SARSCOV2NAA POSITIVE (A) 05/19/2019     COVID-19 Medications: Steroids:10/14>> Remdesivir:10/15>> Actemra: given hx of latent TB-contraindicated Convalescent Plasma:Not indicated-will give if hypoxia worsens Research Studies:N/A  Other medications: Diuretics:Euvolemic-Lasix 40 mg x 1 dose today-to maintain neg balance Antibiotics:Not needed  as no evidence of bacterial infection  Prone/Incentive Spirometry: encouraged incentive spirometry use 3-4/hour.  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  History of prostate cancer s/p radiation therapy  ABG: No results found for: PHART, PCO2ART, PO2ART, HCO3, TCO2, ACIDBASEDEF, O2SAT  Vent Settings: N/A  Condition - Stable  Family Communication  : None available at bedside  Code Status :  Full Code  Diet :  Diet Order            Diet regular Room service appropriate? Yes; Fluid consistency: Thin  Diet effective now               Disposition Plan  :  Remain hospitalized  Barriers to discharge: Needs to complete 5 days of IV remdesivir-still hypoxic  Consults  :  None  Procedures  :  None  Antibiotics  :    Anti-infectives (From admission, onward)   Start     Dose/Rate Route Frequency Ordered Stop   05/20/19 1000  remdesivir 100 mg in sodium chloride 0.9 % 250 mL IVPB     100 mg 500 mL/hr over 30 Minutes Intravenous Every 24 hours 05/19/19 1100 05/23/19 1034   05/20/19 0700  remdesivir 100 mg in sodium chloride 0.9 % 250 mL IVPB  Status:  Discontinued     100 mg 500 mL/hr over 30 Minutes Intravenous Every 24 hours 05/19/19 0646 05/19/19 1059   05/19/19 1200  remdesivir 200 mg in sodium chloride 0.9 % 250 mL IVPB     200 mg 500 mL/hr over 30 Minutes Intravenous Once 05/19/19 1100 05/19/19 1230   05/19/19 0645  remdesivir 200 mg in sodium chloride 0.9 % 250 mL IVPB  Status:  Discontinued     200 mg 500 mL/hr over 30 Minutes Intravenous Once 05/19/19 0646 05/19/19 1059      Inpatient Medications  Scheduled Meds: . enoxaparin (LOVENOX) injection  55 mg Subcutaneous Q24H  . methylPREDNISolone (SOLU-MEDROL) injection  60 mg Intravenous Q12H  . pantoprazole  40 mg Oral Daily   Continuous Infusions: . sodium chloride Stopped (05/19/19 1741)   PRN Meds:.acetaminophen, albuterol, ondansetron  **OR** ondansetron (ZOFRAN) IV   Time Spent in minutes  25  See all Orders from today for further details   Oren Binet M.D on 05/23/2019 at 10:51 AM  To page go to [email or web address] - use universal password  [facility] -  Office  [phone]    Objective:   Vitals:   05/23/19 0500 05/23/19 0738 05/23/19 0750 05/23/19 1005  BP: (!) 128/58  113/76   Pulse: 60  68   Resp:   16   Temp: 98.9 F (37.2 C)  (!) 97.5 F (36.4 C)   TempSrc:   Oral   SpO2: 94% (!) 86% 91% 93%  Weight: 106.7 kg     Height:        Wt Readings from Last 3 Encounters:  05/23/19 106.7 kg     Intake/Output Summary (Last 24 hours) at 05/23/2019 1051 Last data filed at 05/23/2019 0500 Gross per 24 hour  Intake -  Output 1200 ml  Net -1200 ml     Physical Exam Gen Exam:Alert awake-not in any distress HEENT:atraumatic, normocephalic Chest: B/L clear to auscultation anteriorly CVS:S1S2 regular Abdomen:soft non tender, non distended Extremities:no edema Neurology: Non focal Skin: no rash  Data Review:    CBC Recent Labs  Lab 05/18/19 2223  05/19/19 1427 05/20/19 0418 05/21/19 0531 05/22/19 0516 05/23/19 0340  WBC 7.2   < > 7.2 10.3 10.7* 10.2 10.1  HGB 14.0   < > 13.9 13.8 13.8 13.8 13.9  HCT 43.6   < > 39.7 41.5 41.7 41.7 42.0  PLT 236   < > 267 311 314 344 368  MCV 90.5   < > 87.4 88.3 88.9 89.1 89.7  MCH 29.0   < > 30.6 29.4 29.4 29.5 29.7  MCHC 32.1   < >  35.0 33.3 33.1 33.1 33.1  RDW 12.4   < > 12.3 12.4 12.5 12.7 12.6  LYMPHSABS 1.1  --   --  0.8 1.0 0.9 0.9  MONOABS 0.5  --   --  0.5 0.5 0.7 0.7  EOSABS 0.0  --   --  0.0 0.0 0.0 0.0  BASOSABS 0.0  --   --  0.0 0.0 0.1 0.0   < > = values in this interval not displayed.    Chemistries  Recent Labs  Lab 05/19/19 0903 05/19/19 1427 05/20/19 0418 05/21/19 0531 05/22/19 0516 05/23/19 0340  NA 137  --  136 141 138 138  K 4.0  --  4.4 4.3 4.7 5.0  CL 103  --  103 104 104 104  CO2 22  --  23 25 25 25    GLUCOSE 150*  --  161* 151* 135* 108*  BUN 12  --  16 20 18 19   CREATININE 0.96 1.15 1.06 1.00 0.91 0.91  CALCIUM 8.5*  --  8.8* 8.7* 8.7* 8.6*  AST 40  --  47* 30 23 24   ALT 23  --  30 28 22 27   ALKPHOS 30*  --  29* 34* 39 36*  BILITOT 0.9  --  0.7 0.8 0.6 1.0   ------------------------------------------------------------------------------------------------------------------ No results for input(s): CHOL, HDL, LDLCALC, TRIG, CHOLHDL, LDLDIRECT in the last 72 hours.  No results found for: HGBA1C ------------------------------------------------------------------------------------------------------------------ No results for input(s): TSH, T4TOTAL, T3FREE, THYROIDAB in the last 72 hours.  Invalid input(s): FREET3 ------------------------------------------------------------------------------------------------------------------ Recent Labs    05/22/19 0516 05/23/19 0340  FERRITIN 436* 410*    Coagulation profile No results for input(s): INR, PROTIME in the last 168 hours.  Recent Labs    05/22/19 0516 05/23/19 0340  DDIMER 0.60* 0.73*    Cardiac Enzymes No results for input(s): CKMB, TROPONINI, MYOGLOBIN in the last 168 hours.  Invalid input(s): CK ------------------------------------------------------------------------------------------------------------------    Component Value Date/Time   BNP 24.0 05/19/2019 0906    Micro Results Recent Results (from the past 240 hour(s))  SARS Coronavirus 2 by RT PCR (hospital order, performed in [facility] hospital lab) Nasopharyngeal Nasopharyngeal Swab     Status: Abnormal   Collection Time: 05/19/19 12:27 AM   Specimen: Nasopharyngeal Swab  Result Value Ref Range Status   SARS Coronavirus 2 POSITIVE (A) NEGATIVE Final    Comment: RESULT CALLED TO, READ BACK BY AND VERIFIED WITH: T EASTER,RN@[id number]  05/19/19 MKELLY (NOTE) If result is NEGATIVE SARS-CoV-2 target nucleic acids are NOT DETECTED. The SARS-CoV-2 RNA is generally  detectable in upper and lower  respiratory specimens during the acute phase of infection. The lowest  concentration of SARS-CoV-2 viral copies this assay can detect is 250  copies / mL. A negative result does not preclude SARS-CoV-2 infection  and should not be used as the sole basis for treatment or other  patient management decisions.  A negative result may occur with  improper specimen collection / handling, submission of specimen other  than nasopharyngeal swab, presence of viral mutation(s) within the  areas targeted by this assay, and inadequate number of viral copies  (<250 copies / mL). A negative result must be combined with clinical  observations, patient history, and epidemiological information. If result is POSITIVE SARS-CoV-2 target nucleic acids are DETECTED. The  SARS-CoV-2 RNA is generally detectable in upper and lower  respiratory specimens during the acute phase of infection.  Positive  results are indicative of active infection with SARS-CoV-2.  Clinical  correlation  with patient history and other diagnostic information is  necessary to determine patient infection status.  Positive results do  not rule out bacterial infection or co-infection with other viruses. If result is PRESUMPTIVE POSTIVE SARS-CoV-2 nucleic acids MAY BE PRESENT.   A presumptive positive result was obtained on the submitted specimen  and confirmed on repeat testing.  While 2019 novel coronavirus  (SARS-CoV-2) nucleic acids may be present in the submitted sample  additional confirmatory testing may be necessary for epidemiological  and / or clinical management purposes  to differentiate between  SARS-CoV-2 and other Sarbecovirus currently known to infect humans.  If clinically indicated additional testing with an alternate test  methodology [phone]) is ad vised. The SARS-CoV-2 RNA is generally  detectable in upper and lower respiratory specimens during the acute  phase of infection. The  expected result is Negative. Fact Sheet for Patients:  StrictlyIdeas.no Fact Sheet for Healthcare Providers: BankingDealers.co.za This test is not yet approved or cleared by the [place] FDA and has been authorized for detection and/or diagnosis of SARS-CoV-2 by FDA under an Emergency Use Authorization (EUA).  This EUA will remain in effect (meaning this test can be used) for the duration of the COVID-19 declaration under Section 564(b)(1) of the Act, 21 U.S.C. section 360bbb-3(b)(1), unless the authorization is terminated or revoked sooner. Performed at [facility], [address] [place], [place] [address]   MRSA PCR Screening     Status: None   Collection Time: 05/20/19  4:39 AM   Specimen: Nasal Mucosa; Nasopharyngeal  Result Value Ref Range Status   MRSA by PCR NEGATIVE NEGATIVE Final    Comment:        The GeneXpert MRSA Assay (FDA approved for NASAL specimens only), is one component of a comprehensive MRSA colonization surveillance program. It is not intended to diagnose MRSA infection nor to guide or monitor treatment for MRSA infections. Performed at [facility], [place] [address], [place], [place] [address]     Radiology Reports Dg Chest Port 1 View  Result Date: 05/23/2019 CLINICAL DATA:  COVID-19 positive.  Shortness of breath. EXAM: PORTABLE CHEST 1 VIEW COMPARISON:  05/18/2019. FINDINGS: Stable cardiomegaly. No pulmonary venous congestion. Diffuse bilateral pulmonary infiltrates, slight interim improvement from prior exam. Small right pleural effusion noted. No pneumothorax. IMPRESSION: 1. Diffuse bilateral pulmonary infiltrates, slight improvement from prior exam. Small right pleural effusion. 2.  Stable cardiomegaly.  No pulmonary venous congestion. Electronically Signed   By: Marcello Moores  Register   On: 05/23/2019 06:58   Dg Chest Portable 1 View  Result Date: 05/18/2019 CLINICAL DATA:   58 year old male recently diagnosed with COVID-19. Shortness of breath. EXAM: PORTABLE CHEST 1 VIEW COMPARISON:  Person toggle case portable chest 05/16/2019. FINDINGS: Portable AP upright view at 2137 hours. Increasing multifocal bilateral confluent but indistinct pulmonary opacity, more pronounced in the right lung. Stable somewhat low lung volumes. No pneumothorax or pleural effusion. Mediastinal contours and tracheal air column remain within normal limits. Negative visible bowel gas. No acute osseous abnormality identified. IMPRESSION: Increasing bilateral airspace opacity since 05/16/2019 compatible with progressive bilateral COVID-19 pneumonia in this clinical setting. Electronically Signed   By: Genevie Ann M.D.   On: 05/18/2019 21:56

## 2019-05-24 DIAGNOSIS — R0602 Shortness of breath: Secondary | ICD-10-CM

## 2019-05-24 LAB — COMPREHENSIVE METABOLIC PANEL
ALT: 25 U/L (ref 0–44)
AST: 21 U/L (ref 15–41)
Albumin: 2.8 g/dL — ABNORMAL LOW (ref 3.5–5.0)
Alkaline Phosphatase: 36 U/L — ABNORMAL LOW (ref 38–126)
Anion gap: 10 (ref 5–15)
BUN: 19 mg/dL (ref 6–20)
CO2: 24 mmol/L (ref 22–32)
Calcium: 8.3 mg/dL — ABNORMAL LOW (ref 8.9–10.3)
Chloride: 103 mmol/L (ref 98–111)
Creatinine, Ser: 0.93 mg/dL (ref 0.61–1.24)
GFR calc Af Amer: >60 mL/min (ref >60)
GFR calc non Af Amer: >60 mL/min (ref >60)
Glucose, Bld: 123 mg/dL — ABNORMAL HIGH (ref 70–99)
Potassium: 5 mmol/L (ref 3.5–5.1)
Sodium: 137 mmol/L (ref 135–145)
Total Bilirubin: 1.1 mg/dL (ref 0.3–1.2)
Total Protein: 6.5 g/dL (ref 6.5–8.1)

## 2019-05-24 LAB — CBC WITH DIFFERENTIAL/PLATELET
Abs Immature Granulocytes: 0.16 10*3/uL — ABNORMAL HIGH (ref 0.00–0.07)
Basophils Absolute: 0 10*3/uL (ref 0.0–0.1)
Basophils Relative: 0 %
Eosinophils Absolute: 0 10*3/uL (ref 0.0–0.5)
Eosinophils Relative: 0 %
HCT: 44.5 % (ref 39.0–52.0)
Hemoglobin: 14.4 g/dL (ref 13.0–17.0)
Immature Granulocytes: 2 %
Lymphocytes Relative: 10 %
Lymphs Abs: 1.1 10*3/uL (ref 0.7–4.0)
MCH: 29.2 pg (ref 26.0–34.0)
MCHC: 32.4 g/dL (ref 30.0–36.0)
MCV: 90.3 fL (ref 80.0–100.0)
Monocytes Absolute: 0.6 10*3/uL (ref 0.1–1.0)
Monocytes Relative: 6 %
Neutro Abs: 8.9 10*3/uL — ABNORMAL HIGH (ref 1.7–7.7)
Neutrophils Relative %: 82 %
Platelets: 387 10*3/uL (ref 150–400)
RBC: 4.93 MIL/uL (ref 4.22–5.81)
RDW: 12.5 % (ref 11.5–15.5)
WBC: 10.7 10*3/uL — ABNORMAL HIGH (ref 4.0–10.5)
nRBC: 0 % (ref 0.0–0.2)

## 2019-05-24 LAB — FERRITIN: Ferritin: 352 ng/mL — ABNORMAL HIGH (ref 24–336)

## 2019-05-24 LAB — D-DIMER, QUANTITATIVE: D-Dimer, Quant: 0.74 ug/mL-FEU — ABNORMAL HIGH (ref 0.00–0.50)

## 2019-05-24 LAB — BRAIN NATRIURETIC PEPTIDE: B Natriuretic Peptide: 61.3 pg/mL (ref 0.0–100.0)

## 2019-05-24 LAB — C-REACTIVE PROTEIN: CRP: 0.8 mg/dL (ref <1.0)

## 2019-05-24 MED ORDER — PANTOPRAZOLE SODIUM 40 MG PO TBEC: 40.0000 mg | DELAYED_RELEASE_TABLET | Freq: Every day | ORAL | 0 refills | Status: AC

## 2019-05-24 MED ORDER — PREDNISONE 10 MG PO TABS: ORAL_TABLET | ORAL | 0 refills | Status: AC

## 2019-05-24 MED ORDER — FUROSEMIDE 10 MG/ML IJ SOLN
40.0000 mg | Freq: Once | INTRAMUSCULAR | Status: AC
  Administered 2019-05-24: 09:00:00 40 mg via INTRAVENOUS
  Filled 2019-05-24: qty 4

## 2019-05-24 MED ORDER — ALBUTEROL SULFATE HFA 108 (90 BASE) MCG/ACT IN AERS: 1.0000 | INHALATION_SPRAY | Freq: Four times a day (QID) | RESPIRATORY_TRACT | 0 refills | Status: AC | PRN

## 2019-05-24 NOTE — Discharge Summary (Signed)
PATIENT DETAILS Name: Philip Reed Age: 58 y.o. Sex: male Date of Birth: 1960/11/01 MRN: [id number]. Admitting Physician: Oswald Hillock, MD QP:[id number], No Pcp Per  Admit Date: 05/18/2019 Discharge date: 05/24/2019  Recommendations for Outpatient Follow-up:  1. Follow up with PCP in 1-2 weeks 2. Please obtain CMP/CBC in one week 3. Repeat Chest Xray in 4-6 week 4. Please titrate off O2 in the next few weeks 5. Needs sleep study at some point in the near [place] for OSA due to nocturnal desaturation  Admitted From:  [facility] facility  Disposition: [place]: No  Equipment/Devices: Oxygen 2L at rest, 4 L with ambulation   Discharge Condition: Stable  CODE STATUS: FULL CODE  Diet recommendation:  Diet Order            Diet general        Diet regular Room service appropriate? Yes; Fluid consistency: Thin  Diet effective now               Brief Summary: See H&P, Labs, Consult and Test reports for all details in brief, Patient is a 58 y.o. male with PMHx of  hepatitis C (per patient treated), prostate cancer s/p radiation therapy-incarcerated at [place] diagnosed with COVID 19 approximately one week at a outside facility-presenting with worsening SOB-found to have acute hypoxic respiratory failure secondary to COVID 19 PNA. See below for further details.   Brief Hospital Course: Acute Hypoxic Respiratory Failure:clinically improved-feels much better.Treated with steroids and 5 day course of remdesivir. Will be discharged on a steroid taper. On room air this morning-but at times requires about 2 L of O2 at rest. Requires around 4L of O2 with ambulation. However he doesn't feel SOB with ambulation. [facility]-they [facility] COVID beds-ok to discharge today.   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 [place] [address]   Infection Prevention Recommendations for Individuals Confirmed to have, or Being Evaluated for, 2019 Novel Coronavirus (COVID-19) Infection Who Receive Care at Home  Individuals who are confirmed to have, or are being evaluated for, COVID-19 should follow the prevention steps below until a healthcare provider or local or state health department says they can return to normal activities.  Stay home except to get medical care You should restrict activities outside your home, except for getting medical care. Do not go to work, school, or public areas, and do not use public transportation or taxis.  Call ahead before visiting your doctor Before your medical appointment, call the healthcare provider and tell them that you have, or are being evaluated for, COVID-19 infection. This will help the healthcare providers office take  steps to keep other people from getting infected. Ask your healthcare provider to call the local or state health department.  Monitor your symptoms Seek prompt medical attention if your illness is worsening (e.g., difficulty breathing). Before going to your medical appointment, call the healthcare provider  and tell them that you have, or are being evaluated for, COVID-19 infection. Ask your healthcare provider to call the local or state health department.  Wear a facemask You should wear a facemask that covers your nose and mouth when you are in the same room with other people and when you visit a healthcare provider. People who live with or visit you should also wear a facemask while they are in the same room with you.  Separate yourself from other people in your home As much as possible, you should stay in a different room from other people in your home. Also, you should use a separate bathroom, if available.  Avoid sharing household items You should not share dishes, drinking glasses, cups, eating utensils, towels, bedding, or other items with other people in your home. After using these items, you should wash them thoroughly with soap and water.  Cover your coughs and sneezes Cover your mouth and nose with a tissue when you cough or sneeze, or you can cough or sneeze into your sleeve. Throw used tissues in a lined trash can, and immediately wash your hands with soap and water for at least 20 seconds or use an alcohol-based hand rub.  Wash your [facility] your hands often and thoroughly with soap and water for at least 20 seconds. You can use an alcohol-based hand sanitizer if soap and water are not available and if your hands are not visibly dirty. Avoid touching your eyes, nose, and mouth with unwashed hands.   Prevention Steps for Caregivers and Household Members of Individuals Confirmed to have, or Being Evaluated for, COVID-19 Infection Being Cared for in the Home  If you live with, or provide care at home for, a person confirmed to have, or being evaluated for, COVID-19 infection please follow these guidelines to prevent infection:  Follow healthcare providers instructions Make sure that you understand and can help the patient follow any healthcare provider instructions for  all care.  Provide for the patients basic needs You should help the patient with basic needs in the home and provide support for getting groceries, prescriptions, and other personal needs.  Monitor the patients symptoms If they are getting sicker, call his or her medical provider and tell them that the patient has, or is being evaluated for, COVID-19 infection. This will help the healthcare providers office take steps to keep other people from getting infected. Ask the healthcare provider to call the local or state health department.  Limit the number of people who have contact with the patient  If possible, have only one caregiver for the patient.  Other household members should stay in another home or place of residence. If this is not possible, they should stay  in another room, or be separated from the patient as much as possible. Use a separate bathroom, if available.  Restrict visitors who do not have an essential need to be in the home.  Keep older adults, very young children, and other sick people away from the patient Keep older adults, very young children, and those who have compromised immune systems or chronic health conditions away from the patient. This includes people with chronic heart, lung, or kidney conditions, diabetes, and cancer.  Ensure good ventilation Make sure that shared spaces in the home have good air flow, such as from an air conditioner or an opened window, weather permitting.  Wash your hands often  Wash your hands often and thoroughly with soap and water for at least 20 seconds. You can use an alcohol based hand sanitizer if soap and water are not available and if your hands are not visibly dirty.  Avoid touching your eyes, nose, and mouth with unwashed hands.  Use disposable paper towels to dry your hands. If not available, use dedicated cloth towels and replace them when they become wet.  Wear a facemask and gloves  Wear a disposable  facemask at all times in the room and gloves when you touch or have contact with the patients blood, body fluids, and/or secretions or excretions, such as sweat, saliva, sputum, nasal mucus, vomit, urine, or feces.  Ensure the mask fits over your nose and mouth tightly, and do not touch it during use.  Throw out disposable facemasks and gloves after using them. Do not reuse.  Wash your hands immediately after removing your facemask and gloves.  If your personal clothing becomes contaminated, carefully remove clothing and launder. Wash your hands after handling contaminated clothing.  Place all used disposable facemasks, gloves, and other waste in a lined container before disposing them with other household waste.  Remove gloves and wash your hands immediately after handling these items.  Do not share dishes, glasses, or other household items with the patient  Avoid sharing household items. You should not share dishes, drinking glasses, cups, eating utensils, towels, bedding, or other items with a patient who is confirmed to have, or being evaluated for, COVID-19 infection.  After the person uses these items, you should wash them thoroughly with soap and water.  Wash laundry thoroughly  Immediately remove and wash clothes or bedding that have blood, body fluids, and/or secretions or excretions, such as sweat, saliva, sputum, nasal mucus, vomit, urine, or feces, on them.  Wear gloves when handling laundry from the patient.  Read and follow directions on labels of laundry or clothing items and detergent. In general, wash and dry with the warmest temperatures recommended on the label.  Clean all areas the individual has used often  Clean all touchable surfaces, such as counters, tabletops, doorknobs, bathroom fixtures, toilets, phones, keyboards, tablets, and bedside tables, every day. Also, clean any surfaces that may have blood, body fluids, and/or secretions or excretions on them.  Wear  gloves when cleaning surfaces the patient has come in contact with.  Use a diluted bleach solution (e.g., dilute bleach with 1 part bleach and 10 parts water) or a household disinfectant with a label that says EPA-registered for coronaviruses. To make a bleach solution at home, add 1 tablespoon of bleach to 1 quart (4 cups) of water. For a larger supply, add  cup of bleach to 1 gallon (16 cups) of water.  Read labels of cleaning products and follow recommendations provided on product labels. Labels contain instructions for safe and effective use of the cleaning product including precautions you should take when applying the product, such as wearing gloves or eye protection and making sure you have good ventilation during use of the product.  Remove gloves and wash hands immediately after cleaning.  Monitor yourself for signs and symptoms of illness Caregivers and household members are considered close contacts, should monitor their health, and will be asked to limit movement outside of the home to the extent  possible. Follow the monitoring steps for close contacts listed on the symptom monitoring form.   ? If you have additional questions, contact your local health department or call the epidemiologist on call at [phone] (available 24/7). ? This guidance is subject to change. For the most up-to-date guidance from [facility], please refer to their website: YouBlogs.pl    Activity:  As tolerated   Discharge Instructions    Call MD for:  difficulty breathing, headache or visual disturbances   Complete by: As directed    Call MD for:  extreme fatigue   Complete by: As directed    Diet general   Complete by: As directed    Discharge instructions   Complete by: As directed    Follow with Primary MD  in 1-2 weeks  Please get a complete blood count and chemistry panel checked by your Primary MD at your next visit, and again as  instructed by your Primary MD.  Get Medicines reviewed and adjusted: Please take all your medications with you for your next visit with your Primary MD  Laboratory/radiological data: Please request your Primary MD to go over all hospital tests and procedure/radiological results at the follow up, please ask your Primary MD to get all Hospital records sent to his/her office.  In some cases, they will be blood work, cultures and biopsy results pending at the time of your discharge. Please request that your primary care M.D. follows up on these results.  Also Note the following: If you experience worsening of your admission symptoms, develop shortness of breath, life threatening emergency, suicidal or homicidal thoughts you must seek medical attention immediately by calling 911 or calling your MD immediately  if symptoms less severe.  You must read complete instructions/literature along with all the possible adverse reactions/side effects for all the Medicines you take and that have been prescribed to you. Take any new Medicines after you have completely understood and accpet all the possible adverse reactions/side effects.   Do not drive when taking Pain medications or sleeping medications (Benzodaizepines)  Do not take more than prescribed Pain, Sleep and Anxiety Medications. It is not advisable to combine anxiety,sleep and pain medications without talking with your primary care practitioner  Special Instructions: If you have smoked or chewed Tobacco  in the last 2 yrs please stop smoking, stop any regular Alcohol  and or any Recreational drug use.  Wear Seat belts while driving.  Please note: You were cared for by a hospitalist during your hospital stay. Once you are discharged, your primary care physician will handle any further medical issues. Please note that NO REFILLS for any discharge medications will be authorized once you are discharged, as it is imperative that you return to your  primary care physician (or establish a relationship with a primary care physician if you do not have one) for your post hospital discharge needs so that they can reassess your need for medications and monitor your lab values.   Increase activity slowly   Complete by: As directed      Allergies as of 05/24/2019   Not on File     Medication List    TAKE these medications   albuterol 108 (90 Base) MCG/ACT inhaler Commonly known as: VENTOLIN HFA Inhale 1 puff into the lungs every 6 (six) hours as needed for wheezing or shortness of breath.   pantoprazole 40 MG tablet Commonly known as: PROTONIX Take 1 tablet (40 mg total) by mouth daily. Start taking on: May 25, 2019   predniSONE 10 MG tablet Commonly known as: DELTASONE Take 40 mg daily for 2 days, 30 mg daily for 2 days, 20 mg daily for 2 days,10 mg daily for 1 days, then stop            Durable Medical Equipment  (From admission, onward)         Start     Ordered   05/23/19 1702  For home use only DME oxygen  Once    Question Answer Comment  Length of Need 6 Months   Mode or (Route) Nasal cannula   Liters per Minute 3   Frequency Continuous (stationary and portable oxygen unit needed)   Oxygen delivery system Gas      05/23/19 1701         Follow-up Information    Primary Care MD. Schedule an appointment as soon as possible for a visit in 2 week(s).          Not on File  Consultations:   None   Other Procedures/Studies: Dg Chest Port 1 View  Result Date: 05/23/2019 CLINICAL DATA:  COVID-19 positive.  Shortness of breath. EXAM: PORTABLE CHEST 1 VIEW COMPARISON:  05/18/2019. FINDINGS: Stable cardiomegaly. No pulmonary venous congestion. Diffuse bilateral pulmonary infiltrates, slight interim improvement from prior exam. Small right pleural effusion noted. No pneumothorax. IMPRESSION: 1. Diffuse bilateral pulmonary infiltrates, slight improvement from prior exam. Small right pleural effusion. 2.   Stable cardiomegaly.  No pulmonary venous congestion. Electronically Signed   By: Marcello Moores  Register   On: 05/23/2019 06:58   Dg Chest Portable 1 View  Result Date: 05/18/2019 CLINICAL DATA:  58 year old male recently diagnosed with COVID-19. Shortness of breath. EXAM: PORTABLE CHEST 1 VIEW COMPARISON:  Person toggle case portable chest 05/16/2019. FINDINGS: Portable AP upright view at 2137 hours. Increasing multifocal bilateral confluent but indistinct pulmonary opacity, more pronounced in the right lung. Stable somewhat low lung volumes. No pneumothorax or pleural effusion. Mediastinal contours and tracheal air column remain within normal limits. Negative visible bowel gas. No acute osseous abnormality identified. IMPRESSION: Increasing bilateral airspace opacity since 05/16/2019 compatible with progressive bilateral COVID-19 pneumonia in this clinical setting. Electronically Signed   By: Genevie Ann M.D.   On: 05/18/2019 21:56     TODAY-DAY OF DISCHARGE:  Subjective:   [name] today has no headache,no chest abdominal pain,no new weakness tingling or numbness, feels much better wants to go home today.   Objective:   Blood pressure 115/83, pulse (!) 110, temperature 97.7 F (36.5 C), temperature source Oral, resp. rate 18, height 6\' 3"  (1.905 m), weight 106.7 kg, SpO2 93 %.  Intake/Output Summary (Last 24 hours) at 05/24/2019 0905 Last data filed at 05/24/2019 0804 Gross per 24 hour  Intake 250 ml  Output 1875 ml  Net -1625 ml   Filed Weights   05/19/19 0907 05/22/19 0535 05/23/19 0500  Weight: 108.9 kg 106.7 kg 106.7 kg    Exam: Awake Alert, Oriented *3, No new F.N deficits, Normal affect [place].AT,PERRAL Supple Neck,No JVD, No cervical lymphadenopathy appriciated.  Symmetrical Chest wall movement, Good air movement bilaterally, CTAB RRR,No Gallops,Rubs or new Murmurs, No Parasternal Heave +ve B.Sounds, Abd Soft, Non tender, No organomegaly appriciated, No rebound -guarding or  rigidity. No Cyanosis, Clubbing or edema, No new Rash or bruise   PERTINENT RADIOLOGIC STUDIES: Dg Chest Port 1 View  Result Date: 05/23/2019 CLINICAL DATA:  COVID-19 positive.  Shortness of breath. EXAM: PORTABLE CHEST 1 VIEW COMPARISON:  05/18/2019. FINDINGS:  Stable cardiomegaly. No pulmonary venous congestion. Diffuse bilateral pulmonary infiltrates, slight interim improvement from prior exam. Small right pleural effusion noted. No pneumothorax. IMPRESSION: 1. Diffuse bilateral pulmonary infiltrates, slight improvement from prior exam. Small right pleural effusion. 2.  Stable cardiomegaly.  No pulmonary venous congestion. Electronically Signed   By: Marcello Moores  Register   On: 05/23/2019 06:58   Dg Chest Portable 1 View  Result Date: 05/18/2019 CLINICAL DATA:  58 year old male recently diagnosed with COVID-19. Shortness of breath. EXAM: PORTABLE CHEST 1 VIEW COMPARISON:  Person toggle case portable chest 05/16/2019. FINDINGS: Portable AP upright view at 2137 hours. Increasing multifocal bilateral confluent but indistinct pulmonary opacity, more pronounced in the right lung. Stable somewhat low lung volumes. No pneumothorax or pleural effusion. Mediastinal contours and tracheal air column remain within normal limits. Negative visible bowel gas. No acute osseous abnormality identified. IMPRESSION: Increasing bilateral airspace opacity since 05/16/2019 compatible with progressive bilateral COVID-19 pneumonia in this clinical setting. Electronically Signed   By: Genevie Ann M.D.   On: 05/18/2019 21:56     PERTINENT LAB RESULTS: CBC: Recent Labs    05/23/19 0340 05/24/19 0345  WBC 10.1 10.7*  HGB 13.9 14.4  HCT 42.0 44.5  PLT 368 387   CMET CMP     Component Value Date/Time   NA 137 05/24/2019 0345   K 5.0 05/24/2019 0345   CL 103 05/24/2019 0345   CO2 24 05/24/2019 0345   GLUCOSE 123 (H) 05/24/2019 0345   BUN 19 05/24/2019 0345   CREATININE 0.93 05/24/2019 0345   CALCIUM 8.3 (L)  05/24/2019 0345   PROT 6.5 05/24/2019 0345   ALBUMIN 2.8 (L) 05/24/2019 0345   AST 21 05/24/2019 0345   ALT 25 05/24/2019 0345   ALKPHOS 36 (L) 05/24/2019 0345   BILITOT 1.1 05/24/2019 0345   GFRNONAA >60 05/24/2019 0345   GFRAA >60 05/24/2019 0345    GFR Estimated Creatinine Clearance: 115.8 mL/min (by C-G formula based on SCr of 0.93 mg/dL). No results for input(s): LIPASE, AMYLASE in the last 72 hours. No results for input(s): CKTOTAL, CKMB, CKMBINDEX, TROPONINI in the last 72 hours. Invalid input(s): POCBNP Recent Labs    05/23/19 0340 05/24/19 0345  DDIMER 0.73* 0.74*   No results for input(s): HGBA1C in the last 72 hours. No results for input(s): CHOL, HDL, LDLCALC, TRIG, CHOLHDL, LDLDIRECT in the last 72 hours. No results for input(s): TSH, T4TOTAL, T3FREE, THYROIDAB in the last 72 hours.  Invalid input(s): FREET3 Recent Labs    05/23/19 0340 05/24/19 0500  FERRITIN 410* 352*   Coags: No results for input(s): INR in the last 72 hours.  Invalid input(s): PT Microbiology: Recent Results (from the past 240 hour(s))  SARS Coronavirus 2 by RT PCR (hospital order, performed in [facility] hospital lab) Nasopharyngeal Nasopharyngeal Swab     Status: Abnormal   Collection Time: 05/19/19 12:27 AM   Specimen: Nasopharyngeal Swab  Result Value Ref Range Status   SARS Coronavirus 2 POSITIVE (A) NEGATIVE Final    Comment: RESULT CALLED TO, READ BACK BY AND VERIFIED WITH: T EASTER,RN@[id number]  05/19/19 MKELLY (NOTE) If result is NEGATIVE SARS-CoV-2 target nucleic acids are NOT DETECTED. The SARS-CoV-2 RNA is generally detectable in upper and lower  respiratory specimens during the acute phase of infection. The lowest  concentration of SARS-CoV-2 viral copies this assay can detect is 250  copies / mL. A negative result does not preclude SARS-CoV-2 infection  and should not be used as the sole basis for treatment  or other  patient management decisions.  A negative result may  occur with  improper specimen collection / handling, submission of specimen other  than nasopharyngeal swab, presence of viral mutation(s) within the  areas targeted by this assay, and inadequate number of viral copies  (<250 copies / mL). A negative result must be combined with clinical  observations, patient history, and epidemiological information. If result is POSITIVE SARS-CoV-2 target nucleic acids are DETECTED. The  SARS-CoV-2 RNA is generally detectable in upper and lower  respiratory specimens during the acute phase of infection.  Positive  results are indicative of active infection with SARS-CoV-2.  Clinical  correlation with patient history and other diagnostic information is  necessary to determine patient infection status.  Positive results do  not rule out bacterial infection or co-infection with other viruses. If result is PRESUMPTIVE POSTIVE SARS-CoV-2 nucleic acids MAY BE PRESENT.   A presumptive positive result was obtained on the submitted specimen  and confirmed on repeat testing.  While 2019 novel coronavirus  (SARS-CoV-2) nucleic acids may be present in the submitted sample  additional confirmatory testing may be necessary for epidemiological  and / or clinical management purposes  to differentiate between  SARS-CoV-2 and other Sarbecovirus currently known to infect humans.  If clinically indicated additional testing with an alternate test  methodology [phone]) is ad vised. The SARS-CoV-2 RNA is generally  detectable in upper and lower respiratory specimens during the acute  phase of infection. The expected result is Negative. Fact Sheet for Patients:  StrictlyIdeas.no Fact Sheet for Healthcare Providers: BankingDealers.co.za This test is not yet approved or cleared by the [place] FDA and has been authorized for detection and/or diagnosis of SARS-CoV-2 by FDA under an Emergency Use Authorization (EUA).  This  EUA will remain in effect (meaning this test can be used) for the duration of the COVID-19 declaration under Section 564(b)(1) of the Act, 21 U.S.C. section 360bbb-3(b)(1), unless the authorization is terminated or revoked sooner. Performed at [facility], [address], [place], [place] [address]   MRSA PCR Screening     Status: None   Collection Time: 05/20/19  4:39 AM   Specimen: Nasal Mucosa; Nasopharyngeal  Result Value Ref Range Status   MRSA by PCR NEGATIVE NEGATIVE Final    Comment:        The GeneXpert MRSA Assay (FDA approved for NASAL specimens only), is one component of a comprehensive MRSA colonization surveillance program. It is not intended to diagnose MRSA infection nor to guide or monitor treatment for MRSA infections. Performed at [facility], [place] [address], [place], [place] [address]     FURTHER DISCHARGE INSTRUCTIONS:  Get Medicines reviewed and adjusted: Please take all your medications with you for your next visit with your Primary MD  Laboratory/radiological data: Please request your Primary MD to go over all hospital tests and procedure/radiological results at the follow up, please ask your Primary MD to get all Hospital records sent to his/her office.  In some cases, they will be blood work, cultures and biopsy results pending at the time of your discharge. Please request that your primary care M.D. goes through all the records of your hospital data and follows up on these results.  Also Note the following: If you experience worsening of your admission symptoms, develop shortness of breath, life threatening emergency, suicidal or homicidal thoughts you must seek medical attention immediately by calling 911 or calling your MD immediately  if symptoms less severe.  You  must read complete instructions/literature along with all the possible adverse reactions/side effects for all the Medicines you take and that have been prescribed to  you. Take any new Medicines after you have completely understood and accpet all the possible adverse reactions/side effects.   Do not drive when taking Pain medications or sleeping medications (Benzodaizepines)  Do not take more than prescribed Pain, Sleep and Anxiety Medications. It is not advisable to combine anxiety,sleep and pain medications without talking with your primary care practitioner  Special Instructions: If you have smoked or chewed Tobacco  in the last 2 yrs please stop smoking, stop any regular Alcohol  and or any Recreational drug use.  Wear Seat belts while driving.  Please note: You were cared for by a hospitalist during your hospital stay. Once you are discharged, your primary care physician will handle any further medical issues. Please note that NO REFILLS for any discharge medications will be authorized once you are discharged, as it is imperative that you return to your primary care physician (or establish a relationship with a primary care physician if you do not have one) for your post hospital discharge needs so that they can reassess your need for medications and monitor your lab values.  Total Time spent coordinating discharge including counseling, education and face to face time equals 35 minutes.  Signed: Alma Muegge 05/24/2019 9:05 AM

## 2019-05-24 NOTE — Discharge Instructions (Signed)
Person Under Monitoring Name: Philip Reed  Location: [address] [place] [address]   Infection Prevention Recommendations for Individuals Confirmed to have, or Being Evaluated for, 2019 Novel Coronavirus (COVID-19) Infection Who Receive Care at Home  Individuals who are confirmed to have, or are being evaluated for, COVID-19 should follow the prevention steps below until a healthcare provider or local or state health department says they can return to normal activities.  Stay home except to get medical care You should restrict activities outside your home, except for getting medical care. Do not go to work, school, or public areas, and do not use public transportation or taxis.  Call ahead before visiting your doctor Before your medical appointment, call the healthcare provider and tell them that you have, or are being evaluated for, COVID-19 infection. This will help the healthcare providers office take steps to keep other people from getting infected. Ask your healthcare provider to call the local or state health department.  Monitor your symptoms Seek prompt medical attention if your illness is worsening (e.g., difficulty breathing). Before going to your medical appointment, call the healthcare provider and tell them that you have, or are being evaluated for, COVID-19 infection. Ask your healthcare provider to call the local or state health department.  Wear a facemask You should wear a facemask that covers your nose and mouth when you are in the same room with other people and when you visit a healthcare provider. People who live with or visit you should also wear a facemask while they are in the same room with you.  Separate yourself from other people in your home As much as possible, you should stay in a different room from other people in your home. Also, you should use a separate bathroom, if available.  Avoid sharing household items You should not share dishes,  drinking glasses, cups, eating utensils, towels, bedding, or other items with other people in your home. After using these items, you should wash them thoroughly with soap and water.  Cover your coughs and sneezes Cover your mouth and nose with a tissue when you cough or sneeze, or you can cough or sneeze into your sleeve. Throw used tissues in a lined trash can, and immediately wash your hands with soap and water for at least 20 seconds or use an alcohol-based hand rub.  Wash your [facility] your hands often and thoroughly with soap and water for at least 20 seconds. You can use an alcohol-based hand sanitizer if soap and water are not available and if your hands are not visibly dirty. Avoid touching your eyes, nose, and mouth with unwashed hands.   Prevention Steps for Caregivers and Household Members of Individuals Confirmed to have, or Being Evaluated for, COVID-19 Infection Being Cared for in the Home  If you live with, or provide care at home for, a person confirmed to have, or being evaluated for, COVID-19 infection please follow these guidelines to prevent infection:  Follow healthcare providers instructions Make sure that you understand and can help the patient follow any healthcare provider instructions for all care.  Provide for the patients basic needs You should help the patient with basic needs in the home and provide support for getting groceries, prescriptions, and other personal needs.  Monitor the patients symptoms If they are getting sicker, call his or her medical provider and tell them that the patient has, or is being evaluated for, COVID-19 infection. This will help the healthcare providers office take  steps to keep other people from getting infected. Ask the healthcare provider to call the local or state health department.  Limit the number of people who have contact with the patient  If possible, have only one caregiver for the patient.  Other  household members should stay in another home or place of residence. If this is not possible, they should stay  in another room, or be separated from the patient as much as possible. Use a separate bathroom, if available.  Restrict visitors who do not have an essential need to be in the home.  Keep older adults, very young children, and other sick people away from the patient Keep older adults, very young children, and those who have compromised immune systems or chronic health conditions away from the patient. This includes people with chronic heart, lung, or kidney conditions, diabetes, and cancer.  Ensure good ventilation Make sure that shared spaces in the home have good air flow, such as from an air conditioner or an opened window, weather permitting.  Wash your hands often  Wash your hands often and thoroughly with soap and water for at least 20 seconds. You can use an alcohol based hand sanitizer if soap and water are not available and if your hands are not visibly dirty.  Avoid touching your eyes, nose, and mouth with unwashed hands.  Use disposable paper towels to dry your hands. If not available, use dedicated cloth towels and replace them when they become wet.  Wear a facemask and gloves  Wear a disposable facemask at all times in the room and gloves when you touch or have contact with the patients blood, body fluids, and/or secretions or excretions, such as sweat, saliva, sputum, nasal mucus, vomit, urine, or feces.  Ensure the mask fits over your nose and mouth tightly, and do not touch it during use.  Throw out disposable facemasks and gloves after using them. Do not reuse.  Wash your hands immediately after removing your facemask and gloves.  If your personal clothing becomes contaminated, carefully remove clothing and launder. Wash your hands after handling contaminated clothing.  Place all used disposable facemasks, gloves, and other waste in a lined container before  disposing them with other household waste.  Remove gloves and wash your hands immediately after handling these items.  Do not share dishes, glasses, or other household items with the patient  Avoid sharing household items. You should not share dishes, drinking glasses, cups, eating utensils, towels, bedding, or other items with a patient who is confirmed to have, or being evaluated for, COVID-19 infection.  After the person uses these items, you should wash them thoroughly with soap and water.  Wash laundry thoroughly  Immediately remove and wash clothes or bedding that have blood, body fluids, and/or secretions or excretions, such as sweat, saliva, sputum, nasal mucus, vomit, urine, or feces, on them.  Wear gloves when handling laundry from the patient.  Read and follow directions on labels of laundry or clothing items and detergent. In general, wash and dry with the warmest temperatures recommended on the label.  Clean all areas the individual has used often  Clean all touchable surfaces, such as counters, tabletops, doorknobs, bathroom fixtures, toilets, phones, keyboards, tablets, and bedside tables, every day. Also, clean any surfaces that may have blood, body fluids, and/or secretions or excretions on them.  Wear gloves when cleaning surfaces the patient has come in contact with.  Use a diluted bleach solution (e.g., dilute bleach with 1 part bleach  and 10 parts water) or a household disinfectant with a label that says EPA-registered for coronaviruses. To make a bleach solution at home, add 1 tablespoon of bleach to 1 quart (4 cups) of water. For a larger supply, add  cup of bleach to 1 gallon (16 cups) of water.  Read labels of cleaning products and follow recommendations provided on product labels. Labels contain instructions for safe and effective use of the cleaning product including precautions you should take when applying the product, such as wearing gloves or eye protection  and making sure you have good ventilation during use of the product.  Remove gloves and wash hands immediately after cleaning.  Monitor yourself for signs and symptoms of illness Caregivers and household members are considered close contacts, should monitor their health, and will be asked to limit movement outside of the home to the extent possible. Follow the monitoring steps for close contacts listed on the symptom monitoring form.   ? If you have additional questions, contact your local health department or call the epidemiologist on call at [phone] (available 24/7). ? This guidance is subject to change. For the most up-to-date guidance from [facility], please refer to their website: [email or web address].pl

## 2019-05-24 NOTE — Progress Notes (Signed)
Patient will be discharging to [facility] in [place] scheduled for transportation due to needed o2. Officers at bedside will follow PTAR and provide assistance. No other needs.   Kingsley Spittle, LCSW [facility]  [phone]

## 2020-06-29 IMAGING — CR DG CHEST 1V PORT
1 series · 1 of 1 positions shown · non-contrast
Comparison: Person toggle case portable chest 05/16/2019.

CLINICAL DATA: 57-year-old male recently diagnosed with [id number].
Shortness of breath.

EXAM:
PORTABLE CHEST 1 VIEW

[ap]
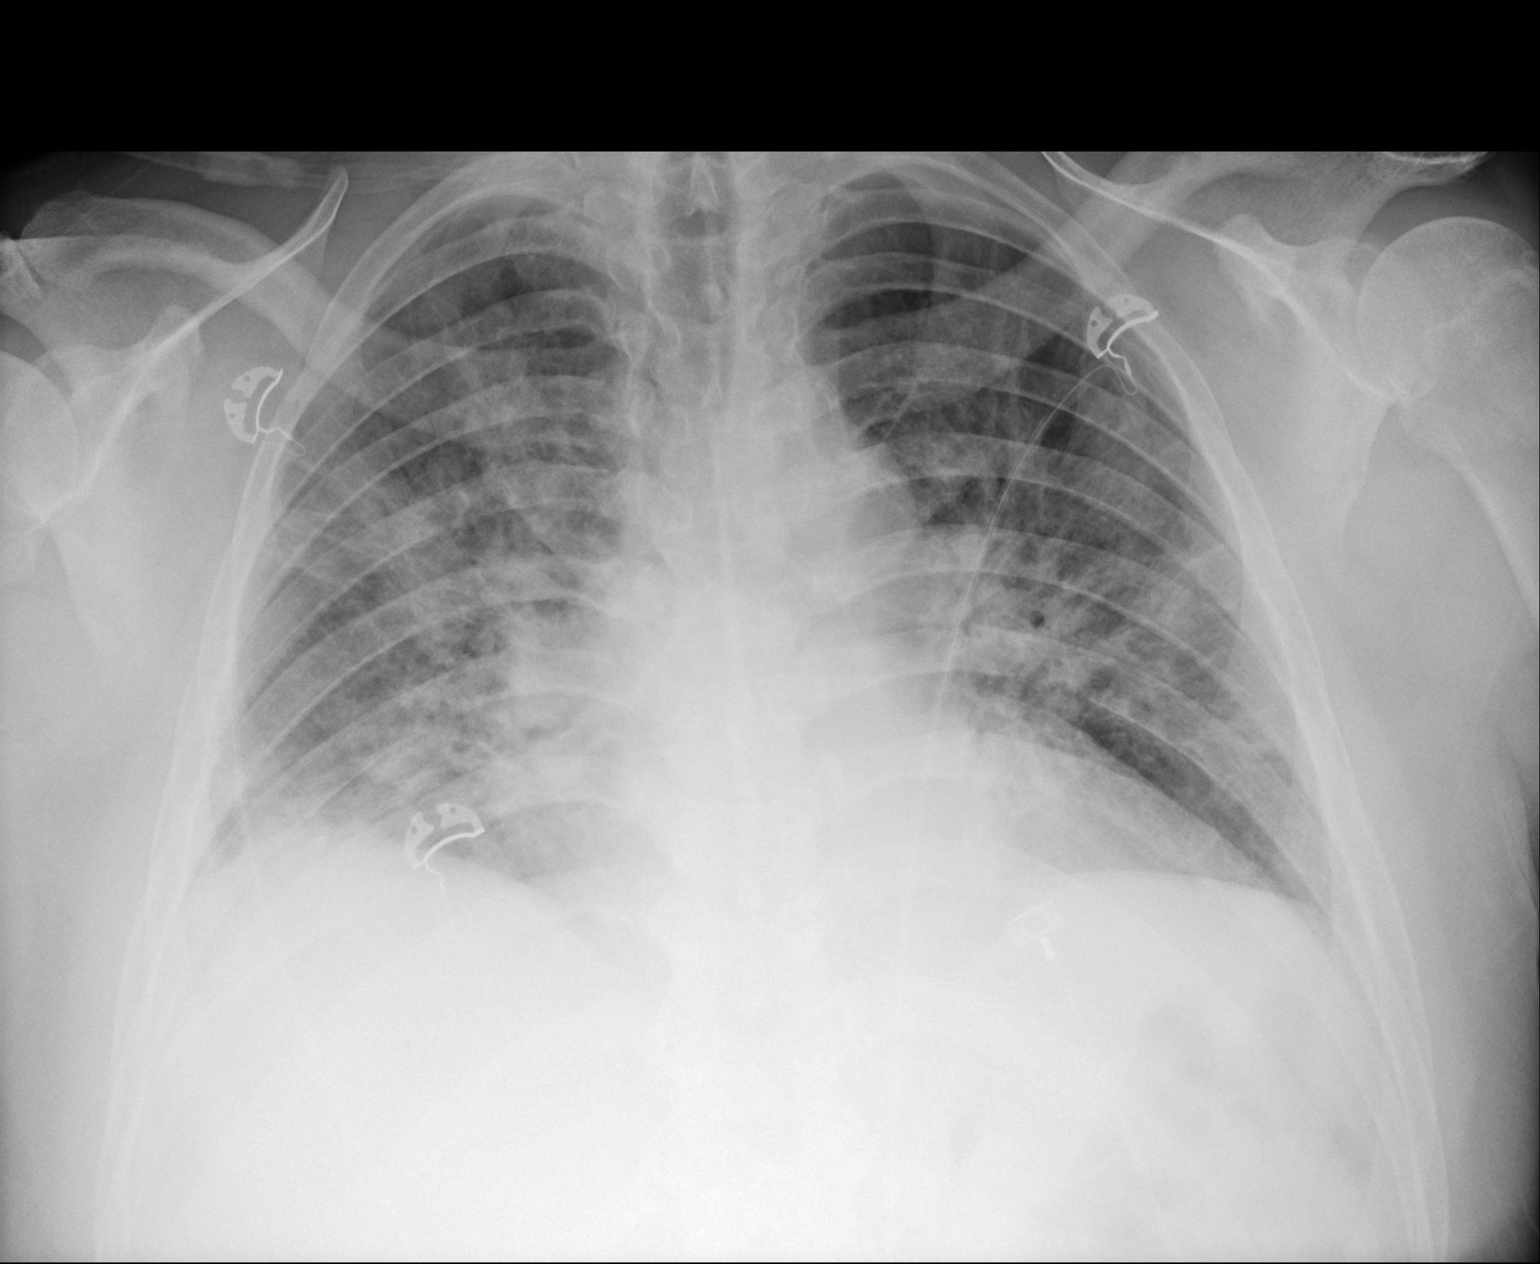

[1 of 1 positions shown; findings below may reference images not displayed]

FINDINGS: Portable AP upright view at 1060 hours. Increasing multifocal
bilateral confluent but indistinct pulmonary opacity, more
pronounced in the right lung. Stable somewhat low lung volumes. No
pneumothorax or pleural effusion. Mediastinal contours and tracheal
air column remain within normal limits. Negative visible bowel gas.
No acute osseous abnormality identified.
IMPRESSION: Increasing bilateral airspace opacity since 05/16/2019 compatible
with progressive bilateral 3MZFR-[id number] pneumonia in this clinical
setting.

## 2020-07-04 IMAGING — DX DG CHEST 1V PORT
1 series · 1 of 1 positions shown · non-contrast
Comparison: 05/18/2019.

CLINICAL DATA: [id number] positive.  Shortness of breath.

EXAM:
PORTABLE CHEST 1 VIEW

[chest]
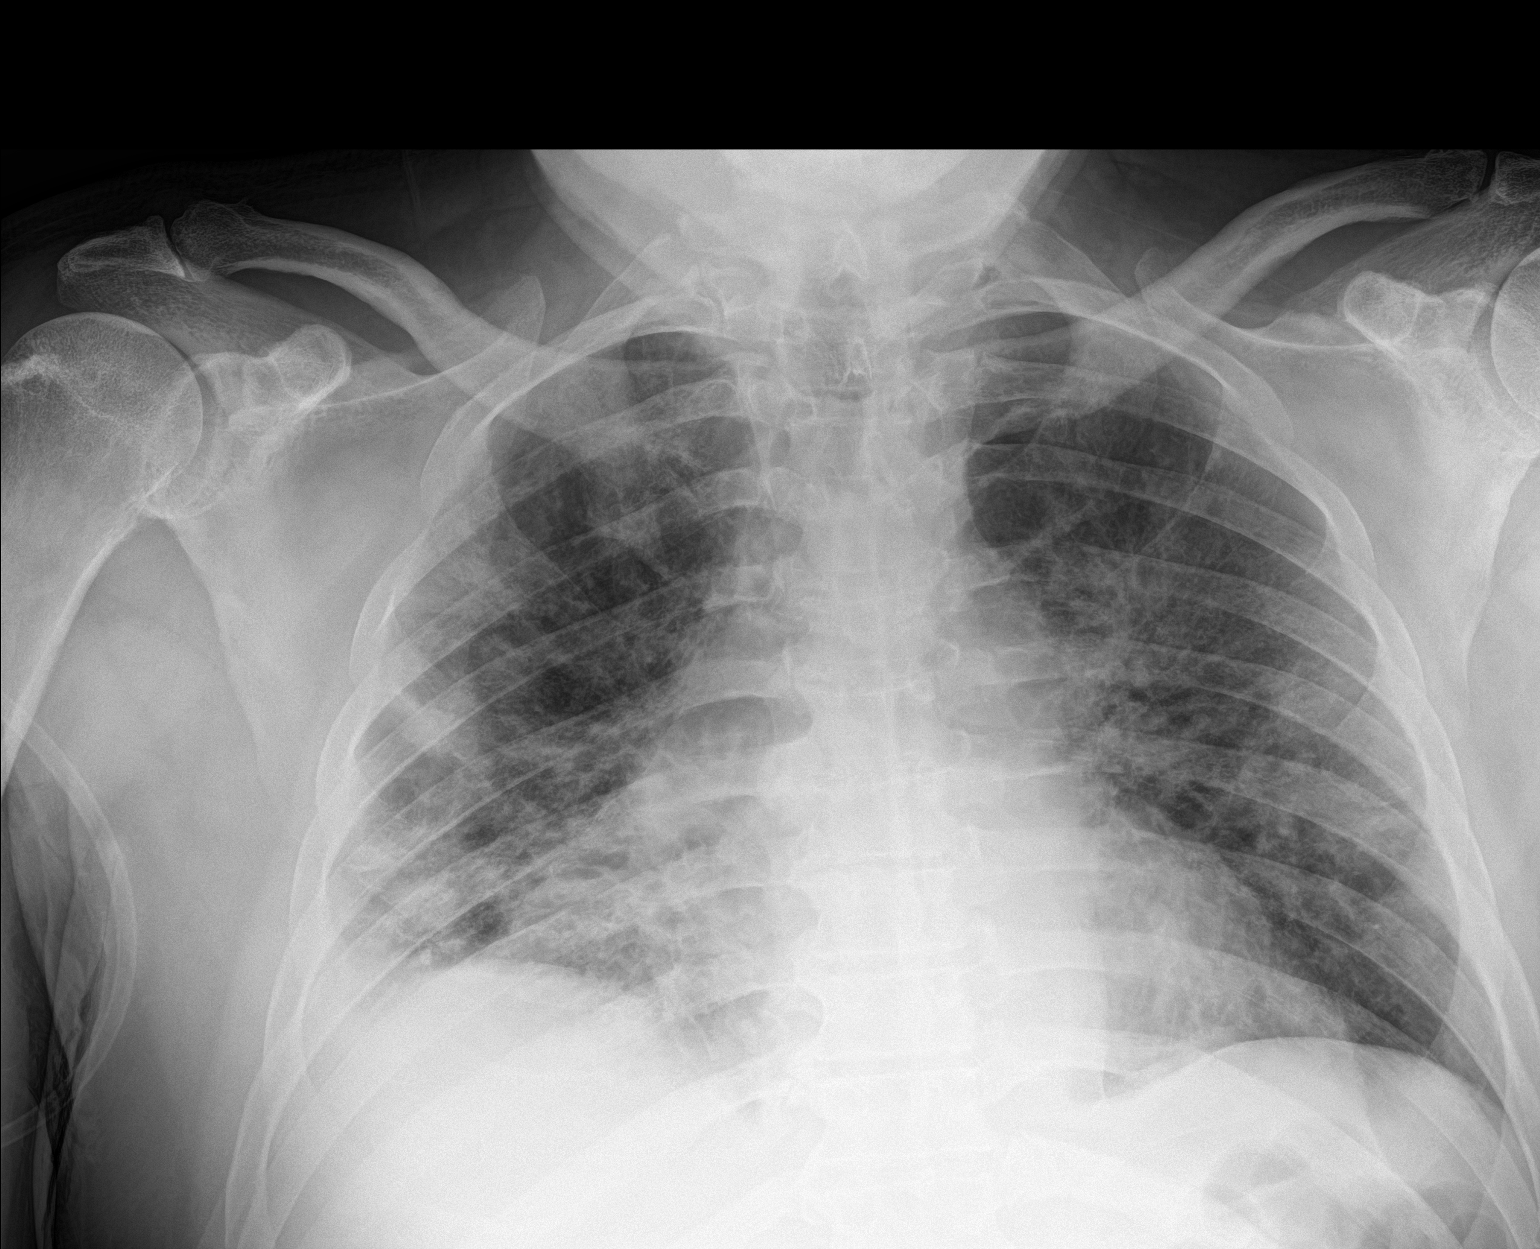

[1 of 1 positions shown; findings below may reference images not displayed]

FINDINGS: Stable cardiomegaly. No pulmonary venous congestion. Diffuse
bilateral pulmonary infiltrates, slight interim improvement from
prior exam. Small right pleural effusion noted. No pneumothorax.
IMPRESSION: 1. Diffuse bilateral pulmonary infiltrates, slight improvement from
prior exam. Small right pleural effusion.

2.  Stable cardiomegaly.  No pulmonary venous congestion.
